# Patient Record
Sex: Male | Born: 1960 | Hispanic: Yes | Marital: Married | Smoking: Current every day smoker
Health system: Southern US, Community
[De-identification: ages and names within clinical notes are randomized; demographics above are authoritative.]

## PROBLEM LIST (undated history)

## (undated) DIAGNOSIS — B182 Chronic viral hepatitis C: Secondary | ICD-10-CM

## (undated) HISTORY — DX: Chronic viral hepatitis C: B18.2

## (undated) HISTORY — PX: CHOLECYSTECTOMY: SHX55

---

## 2015-12-11 ENCOUNTER — Ambulatory Visit: Payer: BLUE CROSS/BLUE SHIELD | Admitting: Family Medicine

## 2016-05-26 ENCOUNTER — Encounter: Payer: Self-pay | Admitting: Family Medicine

## 2016-05-26 ENCOUNTER — Ambulatory Visit (INDEPENDENT_AMBULATORY_CARE_PROVIDER_SITE_OTHER): Payer: BLUE CROSS/BLUE SHIELD | Admitting: Family Medicine

## 2016-05-26 VITALS — BP 106/72 | HR 78 | Temp 97.8°F | Resp 16 | Ht 69.0 in | Wt 203.0 lb

## 2016-05-26 DIAGNOSIS — Z23 Encounter for immunization: Secondary | ICD-10-CM | POA: Diagnosis not present

## 2016-05-26 DIAGNOSIS — B182 Chronic viral hepatitis C: Secondary | ICD-10-CM | POA: Diagnosis not present

## 2016-05-26 DIAGNOSIS — Z0000 Encounter for general adult medical examination without abnormal findings: Secondary | ICD-10-CM | POA: Diagnosis not present

## 2016-05-26 LAB — CBC WITH DIFFERENTIAL/PLATELET
BASOS PCT: 1 %
Basophils Absolute: 75 cells/uL (ref 0–200)
Eosinophils Absolute: 300 cells/uL (ref 15–500)
Eosinophils Relative: 4 %
HEMATOCRIT: 44.9 % (ref 38.5–50.0)
Hemoglobin: 15.5 g/dL (ref 13.0–17.0)
LYMPHS ABS: 1500 {cells}/uL (ref 850–3900)
LYMPHS PCT: 20 %
MCH: 30.2 pg (ref 27.0–33.0)
MCHC: 34.5 g/dL (ref 32.0–36.0)
MCV: 87.4 fL (ref 80.0–100.0)
MONO ABS: 525 {cells}/uL (ref 200–950)
MPV: 10.4 fL (ref 7.5–12.5)
Monocytes Relative: 7 %
NEUTROS ABS: 5100 {cells}/uL (ref 1500–7800)
Neutrophils Relative %: 68 %
Platelets: 255 10*3/uL (ref 140–400)
RBC: 5.14 MIL/uL (ref 4.20–5.80)
RDW: 13 % (ref 11.0–15.0)
WBC: 7.5 10*3/uL (ref 3.8–10.8)

## 2016-05-26 LAB — PSA: PSA: 0.9 ng/mL (ref <=4.0)

## 2016-05-26 NOTE — Addendum Note (Signed)
Addended by: Shary Decamp B on: 05/26/2016 10:51 AM   Modules accepted: Orders

## 2016-05-26 NOTE — Progress Notes (Signed)
Subjective:    Patient ID: Caleb Hunt, male    DOB: Jan 30, 1961, 55 y.o.   MRN: [id number]  HPI Patient has a history of hepatitis C. He was diagnosed with hepatitis C in 1994. He has not been treated for hepatitis C. Otherwise his past medical history is noncontributory. He is due for a prostate exam, PSA, and a screening colonoscopy. He is also due for a flu shot. Family history is significant for type 2 diabetes mellitus. Otherwise his review of systems is negative Past Medical History:  Diagnosis Date  . Hep C w/o coma, chronic (HCC)    Past Surgical History:  Procedure Laterality Date  . CHOLECYSTECTOMY     No current outpatient prescriptions on file prior to visit.   No current facility-administered medications on file prior to visit.    No Known Allergies Social History   Social History  . Marital status: Married    Spouse name: N/A  . Number of children: N/A  . Years of education: N/A   Occupational History  . Not on file.   Social History Main Topics  . Smoking status: Current Every Day Smoker  . Smokeless tobacco: Never Used  . Alcohol use Yes     Comment: every other week  . Drug use: No  . Sexual activity: Not on file   Other Topics Concern  . Not on file   Social History Narrative  . No narrative on file   Family History  Problem Relation Age of Onset  . Diabetes Mother   . Diabetes Father   . Heart disease Father   . Hepatitis C Father   . Diabetes Sister   . Psoriasis Sister       Review of Systems  All other systems reviewed and are negative.      Objective:   Physical Exam  Constitutional: He is oriented to person, place, and time. He appears well-developed and well-nourished. No distress.  HENT:  Head: Normocephalic and atraumatic.  Right Ear: External ear normal.  Left Ear: External ear normal.  Nose: Nose normal.  Mouth/Throat: Oropharynx is clear and moist. No oropharyngeal exudate.  Eyes: Conjunctivae and EOM are normal.  Pupils are equal, round, and reactive to light. Right eye exhibits no discharge. Left eye exhibits no discharge. No scleral icterus.  Neck: Normal range of motion. Neck supple. No JVD present. No tracheal deviation present. No thyromegaly present.  Cardiovascular: Normal rate, regular rhythm, normal heart sounds and intact distal pulses.  Exam reveals no gallop and no friction rub.   No murmur heard. Pulmonary/Chest: Effort normal and breath sounds normal. No stridor. No respiratory distress. He has no wheezes. He has no rales. He exhibits no tenderness.  Abdominal: Soft. Bowel sounds are normal. He exhibits no distension and no mass. There is no tenderness. There is no rebound and no guarding.  Genitourinary: Prostate normal. Rectal exam shows external hemorrhoid. Rectal exam shows no internal hemorrhoid, no fissure, no mass, no tenderness and anal tone normal.  Musculoskeletal: Normal range of motion. He exhibits no edema, tenderness or deformity.  Lymphadenopathy:    He has no cervical adenopathy.  Neurological: He is alert and oriented to person, place, and time. He has normal reflexes. He displays normal reflexes. No cranial nerve deficit. He exhibits normal muscle tone. Coordination normal.  Skin: Skin is warm. No rash noted. He is not diaphoretic. No erythema. No pallor.  Psychiatric: He has a normal mood and affect. His behavior is normal. Judgment  and thought content normal.  Vitals reviewed.         Assessment & Plan:  Routine general medical examination at a health care facility - Plan: CBC with Differential/Platelet, COMPLETE METABOLIC PANEL WITH GFR, Lipid panel, PSA, Ambulatory referral to Gastroenterology  Hep C w/o coma, chronic (HCC) - Plan: Hepatitis C Ab Reflex HCV RNA, QUANT, Hepatitis C Genotype  His blood pressure today is normal. I will check a CBC, CMP, and a fasting lipid panel. His tetanus shot is up-to-date. I did recommend an annual flu shot.. I will schedule the  patient to see a gastroenterologist for screening colonoscopy. I'll check a hepatitis C genotype as well as a hepatitis C viral load. If the patient has active hepatitis C, I will consult the hep C clinic to arrange for treatment.

## 2016-05-27 LAB — COMPLETE METABOLIC PANEL WITH GFR
ALT: 12 U/L (ref 9–46)
AST: 19 U/L (ref 10–35)
Albumin: 4.8 g/dL (ref 3.6–5.1)
Alkaline Phosphatase: 104 U/L (ref 40–115)
BUN: 19 mg/dL (ref 7–25)
CALCIUM: 9.9 mg/dL (ref 8.6–10.3)
CHLORIDE: 101 mmol/L (ref 98–110)
CO2: 27 mmol/L (ref 20–31)
CREATININE: 1.19 mg/dL (ref 0.70–1.33)
GFR, Est African American: 79 mL/min (ref >=60)
GFR, Est Non African American: 68 mL/min (ref >=60)
Glucose, Bld: 91 mg/dL (ref 70–99)
Potassium: 4.3 mmol/L (ref 3.5–5.3)
Sodium: 139 mmol/L (ref 135–146)
Total Bilirubin: 0.7 mg/dL (ref 0.2–1.2)
Total Protein: 7.9 g/dL (ref 6.1–8.1)

## 2016-05-27 LAB — LIPID PANEL
CHOL/HDL RATIO: 3.4 ratio (ref <=5.0)
CHOLESTEROL: 163 mg/dL (ref 125–200)
HDL: 48 mg/dL (ref >=40)
LDL CALC: 91 mg/dL (ref <130)
TRIGLYCERIDES: 121 mg/dL (ref <150)
VLDL: 24 mg/dL (ref <30)

## 2016-05-27 LAB — HEPATITIS C ANTIBODY: HCV Ab: REACTIVE — AB

## 2016-05-28 ENCOUNTER — Other Ambulatory Visit: Payer: Self-pay | Admitting: Family Medicine

## 2016-05-28 DIAGNOSIS — B182 Chronic viral hepatitis C: Secondary | ICD-10-CM

## 2016-05-28 LAB — HEPATITIS C RNA QUANTITATIVE
HCV QUANT: 248 [IU]/mL — AB (ref <15)
HCV Quantitative Log: 2.39 {Log} — ABNORMAL HIGH (ref <1.18)

## 2016-05-29 ENCOUNTER — Encounter (INDEPENDENT_AMBULATORY_CARE_PROVIDER_SITE_OTHER): Payer: Self-pay | Admitting: *Deleted

## 2016-06-01 LAB — HEPATITIS C GENOTYPE

## 2016-06-10 ENCOUNTER — Telehealth: Payer: Self-pay | Admitting: *Deleted

## 2016-06-10 NOTE — Telephone Encounter (Signed)
Left message stating patient had been referred for treatment, asked him to call back for scheduling. Landis Gandy, RN

## 2016-06-17 ENCOUNTER — Encounter: Payer: Self-pay | Admitting: Family Medicine

## 2016-07-08 ENCOUNTER — Telehealth: Payer: Self-pay | Admitting: *Deleted

## 2016-07-08 NOTE — Telephone Encounter (Signed)
Called patient and left a voice mail stating he was referred to this clinic by his PCP and to call Dr. Henreitta Leber office to schedule an appointment. Myrtis Hopping

## 2016-08-21 ENCOUNTER — Telehealth: Payer: Self-pay | Admitting: Family Medicine

## 2016-08-21 NOTE — Telephone Encounter (Signed)
Margaret from infectious disease calling to let you know that he cannot be reached at the number provided for referral

## 2016-08-27 NOTE — Telephone Encounter (Signed)
[facility] have not been able to reach this patient by mail or phone!!!  I am going to try to send letter.  Phone number says does not live here anymore???

## 2019-12-15 ENCOUNTER — Ambulatory Visit: Payer: Self-pay | Attending: Internal Medicine

## 2019-12-15 DIAGNOSIS — Z23 Encounter for immunization: Secondary | ICD-10-CM

## 2019-12-15 NOTE — Progress Notes (Signed)
   Covid-19 Vaccination Clinic  Name:  Osaze Kleinfeldt    MRN: [id number] DOB: [birth year]  12/15/2019  Mr. Gossage was observed post Covid-19 immunization for 15 minutes without incident. He was provided with Vaccine Information Sheet and instruction to access the V-Safe system.   Mr. Gayhart was instructed to call 911 with any severe reactions post vaccine: Marland Kitchen Difficulty breathing  . Swelling of face and throat  . A fast heartbeat  . A bad rash all over body  . Dizziness and weakness   Immunizations Administered    Name Date Dose VIS Date Route   Pfizer COVID-19 Vaccine 12/15/2019 10:29 AM 0.3 mL 10/18/2018 Intramuscular   Manufacturer: [place]   Lot: [id number]   Protection: [id number]

## 2020-01-08 ENCOUNTER — Ambulatory Visit: Payer: Self-pay | Attending: Internal Medicine

## 2020-01-08 DIAGNOSIS — Z23 Encounter for immunization: Secondary | ICD-10-CM

## 2020-01-08 NOTE — Progress Notes (Signed)
   Covid-19 Vaccination Clinic  Name:  Caleb Hunt    MRN: [id number] DOB: 03-22-1961  01/08/2020  Caleb Hunt was observed post Covid-19 immunization for 15 minutes without incident. He was provided with Vaccine Information Sheet and instruction to access the V-Safe system.   Caleb Hunt was instructed to call 911 with any severe reactions post vaccine: Marland Kitchen Difficulty breathing  . Swelling of face and throat  . A fast heartbeat  . A bad rash all over body  . Dizziness and weakness   Immunizations Administered    Name Date Dose VIS Date Route   Pfizer COVID-19 Vaccine 01/08/2020 10:40 AM 0.3 mL 10/18/2018 Intramuscular   Manufacturer: [place]   Lot: [id number]   [place]: [id number]

## 2020-05-17 ENCOUNTER — Encounter: Payer: Self-pay | Admitting: Internal Medicine

## 2020-05-17 ENCOUNTER — Other Ambulatory Visit: Payer: Self-pay

## 2020-05-17 ENCOUNTER — Ambulatory Visit (INDEPENDENT_AMBULATORY_CARE_PROVIDER_SITE_OTHER): Payer: No Typology Code available for payment source | Admitting: Internal Medicine

## 2020-05-17 VITALS — BP 117/77 | HR 68 | Resp 16 | Ht 69.0 in | Wt 223.1 lb

## 2020-05-17 DIAGNOSIS — N529 Male erectile dysfunction, unspecified: Secondary | ICD-10-CM | POA: Diagnosis not present

## 2020-05-17 DIAGNOSIS — K739 Chronic hepatitis, unspecified: Secondary | ICD-10-CM | POA: Diagnosis not present

## 2020-05-17 DIAGNOSIS — B1920 Unspecified viral hepatitis C without hepatic coma: Secondary | ICD-10-CM

## 2020-05-17 DIAGNOSIS — B351 Tinea unguium: Secondary | ICD-10-CM | POA: Diagnosis not present

## 2020-05-17 DIAGNOSIS — Z7689 Persons encountering health services in other specified circumstances: Secondary | ICD-10-CM | POA: Diagnosis not present

## 2020-05-17 DIAGNOSIS — Z23 Encounter for immunization: Secondary | ICD-10-CM

## 2020-05-17 DIAGNOSIS — Z720 Tobacco use: Secondary | ICD-10-CM

## 2020-05-17 DIAGNOSIS — Z1211 Encounter for screening for malignant neoplasm of colon: Secondary | ICD-10-CM | POA: Insufficient documentation

## 2020-05-17 DIAGNOSIS — E669 Obesity, unspecified: Secondary | ICD-10-CM

## 2020-05-17 DIAGNOSIS — B182 Chronic viral hepatitis C: Secondary | ICD-10-CM | POA: Insufficient documentation

## 2020-05-17 MED ORDER — SILDENAFIL CITRATE 50 MG PO TABS: 50.0000 mg | ORAL_TABLET | Freq: Every day | ORAL | 0 refills | Status: DC | PRN

## 2020-05-17 MED ORDER — EFINACONAZOLE 10 % EX SOLN: 1.0000 "application " | Freq: Every day | CUTANEOUS | 1 refills | Status: DC

## 2020-05-17 NOTE — Assessment & Plan Note (Signed)
Previously diagnosed in 1999, was confirmed to have positive RNA in 2017 F/u Hep C antibody and RNA testing Recommend to follow up with GI Will check CMP for now, plan to obtain Abdominal US later

## 2020-05-17 NOTE — Patient Instructions (Signed)
Please get blood tests done before next visit.  You are given referral to GI clinic for colonoscopy.  You advised to quit smoking. Please follow low-salt and low-carbohydrate diet. Perform moderate exercise at least 30 mins/day for 5 days in a week.  Please take medications as prescribed and follow up after 3 weeks.

## 2020-05-17 NOTE — Assessment & Plan Note (Signed)
Efinaconazole cream prescribed Will switch to oral terbinafine after checking CMP

## 2020-05-17 NOTE — Assessment & Plan Note (Signed)
Care established Previous chart reviewed History reviewed with the patient

## 2020-05-17 NOTE — Progress Notes (Signed)
New Patient Office Visit  Subjective:  Patient ID: Caleb Hunt, male    DOB: 09/17/1960  Age: 59 y.o. MRN: [id number]  CC:  Chief Complaint  Patient presents with  . New Patient (Initial Visit)    establish care    HPI Caleb Hunt is a 59 year old male with past medical history of chronic hep C and tobacco use presents for establishing care.  Patient states that he was last evaluated in 2017 by Dr. Dennard Schaumann and was diagnosed with chronic hep C at that time (initial hep C diagnosis was in 1999).  Patient did not follow-up with PCP since then.  He mentions that he has dull RUQ pain occasionally, but denies nausea, vomiting, abdominal pain, constipation or diarrhea.  Patient denies any recent weight or appetite changes.  Patient mentions that he has been having problems with erection for last 2 years, has morning erections sometimes, denies any contributing stressors.  Patient has not had any colonoscopy yet, GI referral provided for colonoscopy.  He states that he smoked about 2 packs/day for 40 years, quit for 2 years and then started smoking 1 cigarette/day since last year.  Upon counseling, patient is willing to quit smoking.  Patient denies taking any medications currently.  Had Covid vaccine in 11/2019 and 12/2019.  Requests flu vaccine today.  Past Medical History:  Diagnosis Date  . Hep C w/o coma, chronic (HCC)     Past Surgical History:  Procedure Laterality Date  . CHOLECYSTECTOMY      Family History  Problem Relation Age of Onset  . Diabetes Mother   . Diabetes Father   . Heart disease Father   . Hepatitis C Father   . Diabetes Sister   . Psoriasis Sister     Social History   Socioeconomic History  . Marital status: Married    Spouse name: Not on file  . Number of children: Not on file  . Years of education: Not on file  . Highest education level: Not on file  Occupational History  . Not on file  Tobacco Use  . Smoking status: Current Every Day  Smoker    Types: Cigarettes  . Smokeless tobacco: Never Used  . Tobacco comment: Smoked 2 packs/day for 40 years, quit for 2 years and started smoking 1 cigarette a day for last 1 year.  Substance and Sexual Activity  . Alcohol use: Yes    Comment: every other week  . Drug use: No  . Sexual activity: Not on file  Other Topics Concern  . Not on file  Social History Narrative  . Not on file   Social Determinants of Health   Financial Resource Strain:   . Difficulty of Paying Living Expenses: Not on file  Food Insecurity:   . Worried About Charity fundraiser in the Last Year: Not on file  . Ran Out of Food in the Last Year: Not on file  Transportation Needs:   . Lack of Transportation (Medical): Not on file  . Lack of Transportation (Non-Medical): Not on file  Physical Activity:   . Days of Exercise per Week: Not on file  . Minutes of Exercise per Session: Not on file  Stress:   . Feeling of Stress : Not on file  Social Connections:   . Frequency of Communication with Friends and Family: Not on file  . Frequency of Social Gatherings with Friends and Family: Not on file  . Attends Religious Services: Not on file  .  Active Member of Clubs or Organizations: Not on file  . Attends Archivist Meetings: Not on file  . Marital Status: Not on file  Intimate Partner Violence:   . Fear of Current or Ex-Partner: Not on file  . Emotionally Abused: Not on file  . Physically Abused: Not on file  . Sexually Abused: Not on file    ROS Review of Systems  Constitutional: Negative for chills and fever.  HENT: Negative for congestion and sore throat.   Eyes: Negative for pain and discharge.  Respiratory: Negative for cough and shortness of breath.   Cardiovascular: Negative for chest pain and palpitations.  Gastrointestinal: Negative for constipation, diarrhea, nausea and vomiting.  Endocrine: Negative for polydipsia and polyuria.  Genitourinary: Negative for dysuria and  hematuria.  Musculoskeletal: Negative for neck pain and neck stiffness.  Skin: Negative for rash.  Neurological: Negative for dizziness, weakness, numbness and headaches.  Psychiatric/Behavioral: Negative for agitation and behavioral problems.    Objective:   Today's Vitals: BP 117/77   Pulse 68   Resp 16   Ht 5\' 9"  (1.753 m)   Wt 223 lb 1.9 oz (101.2 kg)   SpO2 98%   BMI 32.95 kg/m   Physical Exam Vitals reviewed.  Constitutional:      General: He is not in acute distress.    Appearance: He is not diaphoretic.  HENT:     Head: Normocephalic and atraumatic.     Nose: Nose normal.     Mouth/Throat:     Mouth: Mucous membranes are moist.  Eyes:     General: No scleral icterus.    Extraocular Movements: Extraocular movements intact.     Pupils: Pupils are equal, round, and reactive to light.  Cardiovascular:     Rate and Rhythm: Normal rate and regular rhythm.     Pulses: Normal pulses.     Heart sounds: No murmur heard.   Pulmonary:     Breath sounds: Normal breath sounds. No wheezing or rales.  Abdominal:     Palpations: Abdomen is soft.     Tenderness: There is no abdominal tenderness.  Musculoskeletal:     Cervical back: Neck supple. No tenderness.     Right lower leg: No edema.     Left lower leg: No edema.  Skin:    General: Skin is warm.     Comments: Onychomycosis of fingernails (second and third finger of left hand)  Neurological:     General: No focal deficit present.     Mental Status: He is alert and oriented to person, place, and time.  Psychiatric:        Mood and Affect: Mood normal.        Behavior: Behavior normal.     Assessment & Plan:   Encounter to establish care Care established Previous chart reviewed History reviewed with the patient   Hepatitis C virus infection, unspecified chronicity Previously diagnosed in 1999, was confirmed to have positive RNA in 2017 F/u Hep C antibody and RNA testing Recommend to follow up with  GI Will check CMP for now, plan to obtain Abdominal US later  Obesity (BMI 30-39.9) Diet modification and moderate exercise discussed Patient expressed understanding.  Tobacco abuse Smoked 2 packs/day for about 40 years, quit for 2 years and now started smoking 1 cigarette/day for last 1 year  Asked about quitting: confirms they are currently smokeing cigarettes Advise to quit smoking: Educated about QUITTING to reduce the risk of cancer, cardio and cerebrovascular  disease. Assess willingness: Willing to quit Assist with counseling and pharmacotherapy: Counseled for 5 minutes and literature provided. Arrange for follow up: Will follow up and continue to offer help.   Erectile dysfunction Denies any acute stressors Has morning erections sometimes Sildenafil 50 mg as needed  Onychomycosis Efinaconazole cream prescribed Will switch to oral terbinafine after checking CMP  Flu vaccine administered in clinic.  Referral for colonoscopy provided.  Outpatient Encounter Medications as of 05/17/2020  Medication Sig  . Efinaconazole 10 % SOLN Apply 1 application topically daily.  . sildenafil (VIAGRA) 50 MG tablet Take 1 tablet (50 mg total) by mouth daily as needed for erectile dysfunction.   No facility-administered encounter medications on file as of 05/17/2020.    Follow-up: Return in about 3 weeks (around 06/07/2020).   Lindell Spar, MD

## 2020-05-17 NOTE — Assessment & Plan Note (Signed)
Diet modification and moderate exercise discussed Patient expressed understanding. 

## 2020-05-17 NOTE — Assessment & Plan Note (Signed)
Denies any acute stressors Has morning erections sometimes Sildenafil 50 mg as needed

## 2020-05-17 NOTE — Assessment & Plan Note (Signed)
Smoked 2 packs/day for about 40 years, quit for 2 years and now started smoking 1 cigarette/day for last 1 year  Asked about quitting: confirms they are currently smokeing cigarettes Advise to quit smoking: Educated about QUITTING to reduce the risk of cancer, cardio and cerebrovascular disease. Assess willingness: Willing to quit Assist with counseling and pharmacotherapy: Counseled for 5 minutes and literature provided. Arrange for follow up: Will follow up and continue to offer help.

## 2020-05-21 ENCOUNTER — Telehealth: Payer: Self-pay

## 2020-05-21 NOTE — Telephone Encounter (Signed)
Patient lvm stating the two medications prescribed by the provider was not covered by insurance and needed a prior auth. Explained to patient that most insurances will not cover the erectile dysfunction medication. The fungal medication will be changed to an oral medication once his labs can be reviewed. Patient is aware.

## 2020-05-22 ENCOUNTER — Encounter (INDEPENDENT_AMBULATORY_CARE_PROVIDER_SITE_OTHER): Payer: Self-pay | Admitting: Gastroenterology

## 2020-05-22 ENCOUNTER — Telehealth (INDEPENDENT_AMBULATORY_CARE_PROVIDER_SITE_OTHER): Payer: Self-pay | Admitting: *Deleted

## 2020-05-22 ENCOUNTER — Encounter (INDEPENDENT_AMBULATORY_CARE_PROVIDER_SITE_OTHER): Payer: Self-pay | Admitting: *Deleted

## 2020-05-22 ENCOUNTER — Telehealth: Payer: Self-pay

## 2020-05-22 ENCOUNTER — Other Ambulatory Visit: Payer: Self-pay

## 2020-05-22 ENCOUNTER — Ambulatory Visit (INDEPENDENT_AMBULATORY_CARE_PROVIDER_SITE_OTHER): Payer: No Typology Code available for payment source | Admitting: Gastroenterology

## 2020-05-22 VITALS — BP 112/73 | HR 71 | Temp 98.1°F | Ht 69.0 in | Wt 222.5 lb

## 2020-05-22 DIAGNOSIS — B182 Chronic viral hepatitis C: Secondary | ICD-10-CM | POA: Diagnosis not present

## 2020-05-22 DIAGNOSIS — Z1211 Encounter for screening for malignant neoplasm of colon: Secondary | ICD-10-CM

## 2020-05-22 DIAGNOSIS — Z7189 Other specified counseling: Secondary | ICD-10-CM

## 2020-05-22 MED ORDER — PEG 3350-KCL-NA BICARB-NACL 420 G PO SOLR: 4000.0000 mL | Freq: Once | ORAL | 0 refills | Status: AC
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
## 2020-05-22 NOTE — Telephone Encounter (Signed)
Prior auth sent to [employer]. Waiting on response.

## 2020-05-22 NOTE — Telephone Encounter (Signed)
Patient needs trilyte 

## 2020-05-22 NOTE — Patient Instructions (Signed)
Schedule colonoscopy Perform blood workup   
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
## 2020-05-22 NOTE — Telephone Encounter (Signed)
Please start a Pre Auth on Medication Sildenafil

## 2020-05-25 LAB — HEPATITIS C RNA QUANTITATIVE
HCV RNA, PCR, QN (Log): 4.95 log IU/mL — ABNORMAL HIGH
HCV RNA, PCR, QN: 90100 IU/mL — ABNORMAL HIGH

## 2020-05-26 LAB — HEPATITIS C GENOTYPE

## 2020-05-26 LAB — HEPATITIS B CORE ANTIBODY, TOTAL: Hep B Core Total Ab: NONREACTIVE

## 2020-05-26 LAB — HIV ANTIBODY (ROUTINE TESTING W REFLEX): HIV 1&2 Ab, 4th Generation: NONREACTIVE

## 2020-05-26 LAB — HEPATITIS A ANTIBODY, TOTAL: Hepatitis A AB,Total: REACTIVE — AB

## 2020-05-26 LAB — HEPATITIS B SURFACE ANTIGEN: Hepatitis B Surface Ag: NONREACTIVE

## 2020-05-30 ENCOUNTER — Other Ambulatory Visit (INDEPENDENT_AMBULATORY_CARE_PROVIDER_SITE_OTHER): Payer: Self-pay | Admitting: Gastroenterology

## 2020-05-30 DIAGNOSIS — B182 Chronic viral hepatitis C: Secondary | ICD-10-CM

## 2020-06-05 ENCOUNTER — Other Ambulatory Visit (INDEPENDENT_AMBULATORY_CARE_PROVIDER_SITE_OTHER): Payer: Self-pay | Admitting: *Deleted

## 2020-06-07 ENCOUNTER — Ambulatory Visit (INDEPENDENT_AMBULATORY_CARE_PROVIDER_SITE_OTHER): Payer: No Typology Code available for payment source | Admitting: Internal Medicine

## 2020-06-07 ENCOUNTER — Other Ambulatory Visit: Payer: Self-pay

## 2020-06-07 ENCOUNTER — Encounter: Payer: Self-pay | Admitting: Internal Medicine

## 2020-06-07 VITALS — BP 116/75 | HR 65 | Temp 97.4°F | Resp 18 | Ht 69.0 in | Wt 226.8 lb

## 2020-06-07 DIAGNOSIS — B351 Tinea unguium: Secondary | ICD-10-CM | POA: Diagnosis not present

## 2020-06-07 DIAGNOSIS — N529 Male erectile dysfunction, unspecified: Secondary | ICD-10-CM | POA: Diagnosis not present

## 2020-06-07 DIAGNOSIS — B182 Chronic viral hepatitis C: Secondary | ICD-10-CM | POA: Diagnosis not present

## 2020-06-07 MED ORDER — SILDENAFIL CITRATE 50 MG PO TABS: 100.0000 mg | ORAL_TABLET | Freq: Every day | ORAL | 5 refills | Status: DC | PRN

## 2020-06-07 MED ORDER — SILDENAFIL CITRATE 100 MG PO TABS: 100.0000 mg | ORAL_TABLET | Freq: Every day | ORAL | 5 refills | Status: DC | PRN

## 2020-06-07 NOTE — Patient Instructions (Addendum)
Please use barrier contraceptives (I.e., condom) while having sexual intercourse as you have history of chronic hepatitis C. Please follow up with GI doctor for management of hepatitis C and colonoscopy.  Please use Lamisil for nail infection.  Please get the blood tests done as advised.

## 2020-06-08 NOTE — Progress Notes (Signed)
Established Patient Office Visit  Subjective:  Patient ID: Caleb Hunt, male    DOB: 1961-05-30  Age: 59 y.o. MRN: [id number]  CC:  Chief Complaint  Patient presents with  . Follow-up    3 week follow up everything is going ok today     HPI Veryl Hunt is a 59 year old male with PMH of chronic hepatitis C presents for follow up of blood test results. Patient states that he had erectile dysfunction even with Sildenafil 1 tablet, but he has been having better outcome with 2 tablets. He requests a prescription for 100 mg. Patient was counseled for safe sex practices as he and his wife both have chronic hepatitis C.  Patient did not get routine blood tests done. He has done only Hepatitis workup ordered by GI. Patient is scheduled for colonoscopy in the next week.  Past Medical History:  Diagnosis Date  . Hep C w/o coma, chronic (HCC)     Past Surgical History:  Procedure Laterality Date  . CHOLECYSTECTOMY      Family History  Problem Relation Age of Onset  . Diabetes Mother   . Diabetes Father   . Heart disease Father   . Hepatitis C Father   . Diabetes Sister   . Psoriasis Sister     Social History   Socioeconomic History  . Marital status: Married    Spouse name: Not on file  . Number of children: Not on file  . Years of education: Not on file  . Highest education level: Not on file  Occupational History  . Not on file  Tobacco Use  . Smoking status: Current Every Day Smoker    Packs/day: 0.25    Types: Cigarettes  . Smokeless tobacco: Never Used  . Tobacco comment: Patient states he smokes one cigarette daily  Vaping Use  . Vaping Use: Never used  Substance and Sexual Activity  . Alcohol use: Not Currently  . Drug use: No  . Sexual activity: Not on file  Other Topics Concern  . Not on file  Social History Narrative  . Not on file   Social Determinants of Health   Financial Resource Strain:   . Difficulty of Paying Living Expenses: Not on file    Food Insecurity:   . Worried About Charity fundraiser in the Last Year: Not on file  . Ran Out of Food in the Last Year: Not on file  Transportation Needs:   . Lack of Transportation (Medical): Not on file  . Lack of Transportation (Non-Medical): Not on file  Physical Activity:   . Days of Exercise per Week: Not on file  . Minutes of Exercise per Session: Not on file  Stress:   . Feeling of Stress : Not on file  Social Connections:   . Frequency of Communication with Friends and Family: Not on file  . Frequency of Social Gatherings with Friends and Family: Not on file  . Attends Religious Services: Not on file  . Active Member of Clubs or Organizations: Not on file  . Attends Archivist Meetings: Not on file  . Marital Status: Not on file  Intimate Partner Violence:   . Fear of Current or Ex-Partner: Not on file  . Emotionally Abused: Not on file  . Physically Abused: Not on file  . Sexually Abused: Not on file    Outpatient Medications Prior to Visit  Medication Sig Dispense Refill  . MILK THISTLE PO Take 1 capsule by mouth  daily.    . Multiple Vitamins-Minerals (MULTIVITAMIN WITH MINERALS) tablet Take 1 tablet by mouth daily.    . sildenafil (VIAGRA) 50 MG tablet Take 1 tablet (50 mg total) by mouth daily as needed for erectile dysfunction. 10 tablet 0  . Efinaconazole 10 % SOLN Apply 1 application topically daily. (Patient not taking: Reported on 05/22/2020) 8 mL 1   No facility-administered medications prior to visit.    No Known Allergies  ROS Review of Systems  Constitutional: Negative for chills and fever.  HENT: Negative for congestion and sore throat.   Eyes: Negative for pain and discharge.  Respiratory: Negative for cough and shortness of breath.   Cardiovascular: Negative for chest pain and palpitations.  Gastrointestinal: Negative for constipation, diarrhea, nausea and vomiting.  Endocrine: Negative for polydipsia and polyuria.  Genitourinary:  Negative for dysuria and hematuria.  Musculoskeletal: Negative for neck pain and neck stiffness.  Skin: Negative for rash.  Neurological: Negative for dizziness, weakness, numbness and headaches.  Psychiatric/Behavioral: Negative for agitation and behavioral problems.      Objective:    Physical Exam Vitals reviewed.  Constitutional:      General: He is not in acute distress.    Appearance: He is not diaphoretic.  HENT:     Head: Normocephalic and atraumatic.     Nose: Nose normal.     Mouth/Throat:     Mouth: Mucous membranes are moist.  Eyes:     General: No scleral icterus.    Extraocular Movements: Extraocular movements intact.     Pupils: Pupils are equal, round, and reactive to light.  Cardiovascular:     Rate and Rhythm: Normal rate and regular rhythm.     Pulses: Normal pulses.     Heart sounds: No murmur heard.   Pulmonary:     Breath sounds: Normal breath sounds. No wheezing or rales.  Abdominal:     Palpations: Abdomen is soft.     Tenderness: There is no abdominal tenderness.  Musculoskeletal:     Cervical back: Neck supple. No tenderness.     Right lower leg: No edema.     Left lower leg: No edema.  Skin:    General: Skin is warm.     Comments: Onychomycosis of fingernails (second and third finger of left hand)  Neurological:     General: No focal deficit present.     Mental Status: He is alert and oriented to person, place, and time.  Psychiatric:        Mood and Affect: Mood normal.        Behavior: Behavior normal.     BP 116/75 (BP Location: Left Arm, Patient Position: Sitting, Cuff Size: Normal)   Pulse 65   Temp (!) 97.4 F (36.3 C) (Temporal)   Resp 18   Ht 5\' 9"  (1.753 m)   Wt 226 lb 12.8 oz (102.9 kg)   SpO2 97%   BMI 33.49 kg/m  Wt Readings from Last 3 Encounters:  06/07/20 226 lb 12.8 oz (102.9 kg)  05/22/20 222 lb 8 oz (100.9 kg)  05/17/20 223 lb 1.9 oz (101.2 kg)     Health Maintenance Due  Topic Date Due  . TETANUS/TDAP   Never done  . COLONOSCOPY  Never done    There are no preventive care reminders to display for this patient.  No results found for: TSH Lab Results  Component Value Date   WBC 7.5 05/26/2016   HGB 15.5 05/26/2016   HCT 44.9 05/26/2016  MCV 87.4 05/26/2016   PLT 255 05/26/2016   Lab Results  Component Value Date   NA 139 05/26/2016   K 4.3 05/26/2016   CO2 27 05/26/2016   GLUCOSE 91 05/26/2016   BUN 19 05/26/2016   CREATININE 1.19 05/26/2016   BILITOT 0.7 05/26/2016   ALKPHOS 104 05/26/2016   AST 19 05/26/2016   ALT 12 05/26/2016   PROT 7.9 05/26/2016   ALBUMIN 4.8 05/26/2016   CALCIUM 9.9 05/26/2016   Lab Results  Component Value Date   CHOL 163 05/26/2016   Lab Results  Component Value Date   HDL 48 05/26/2016   Lab Results  Component Value Date   LDLCALC 91 05/26/2016   Lab Results  Component Value Date   TRIG 121 05/26/2016   Lab Results  Component Value Date   CHOLHDL 3.4 05/26/2016   No results found for: HGBA1C    Assessment & Plan:   Erectile dysfunction Prescribed Sildenafil 100 mg PRN Advised for safe sex practices, including condom use  Chronic Hep C Follows up with GI Hep C genotype and titer reviewed  Onychomycosis Patient did not start medication as advised. Advised to use Lamisil for now   Meds ordered this encounter  Medications  . DISCONTD: sildenafil (VIAGRA) 50 MG tablet    Sig: Take 2 tablets (100 mg total) by mouth daily as needed for erectile dysfunction.    Dispense:  10 tablet    Refill:  5  . sildenafil (VIAGRA) 100 MG tablet    Sig: Take 1 tablet (100 mg total) by mouth daily as needed for erectile dysfunction.    Dispense:  10 tablet    Refill:  5    Follow-up: Return in about 6 months (around 12/06/2020).    Lindell Spar, MD

## 2020-06-10 ENCOUNTER — Other Ambulatory Visit: Payer: Self-pay

## 2020-06-10 ENCOUNTER — Other Ambulatory Visit (HOSPITAL_COMMUNITY)
Admission: RE | Admit: 2020-06-10 | Discharge: 2020-06-10 | Disposition: A | Discharge status: Home / Self Care | Payer: No Typology Code available for payment source | Source: Ambulatory Visit | Attending: Gastroenterology | Admitting: Gastroenterology

## 2020-06-10 DIAGNOSIS — Z1211 Encounter for screening for malignant neoplasm of colon: Secondary | ICD-10-CM | POA: Diagnosis not present

## 2020-06-10 DIAGNOSIS — D122 Benign neoplasm of ascending colon: Secondary | ICD-10-CM | POA: Diagnosis not present

## 2020-06-10 DIAGNOSIS — D124 Benign neoplasm of descending colon: Secondary | ICD-10-CM | POA: Diagnosis not present

## 2020-06-10 DIAGNOSIS — Z01812 Encounter for preprocedural laboratory examination: Secondary | ICD-10-CM | POA: Insufficient documentation

## 2020-06-10 DIAGNOSIS — K621 Rectal polyp: Secondary | ICD-10-CM

## 2020-06-10 DIAGNOSIS — D123 Benign neoplasm of transverse colon: Secondary | ICD-10-CM | POA: Diagnosis not present

## 2020-06-10 DIAGNOSIS — Z20822 Contact with and (suspected) exposure to covid-19: Secondary | ICD-10-CM | POA: Insufficient documentation

## 2020-06-10 LAB — SARS CORONAVIRUS 2 (TAT 6-24 HRS): SARS Coronavirus 2: NEGATIVE

## 2020-06-11 ENCOUNTER — Encounter (HOSPITAL_COMMUNITY): Payer: Self-pay | Admitting: Gastroenterology

## 2020-06-11 ENCOUNTER — Other Ambulatory Visit: Payer: Self-pay

## 2020-06-11 ENCOUNTER — Encounter (HOSPITAL_COMMUNITY)
Admission: RE | Disposition: A | Discharge status: Home / Self Care | Payer: Self-pay | Source: Ambulatory Visit | Attending: Gastroenterology

## 2020-06-11 ENCOUNTER — Ambulatory Visit (HOSPITAL_COMMUNITY): Payer: PRIVATE HEALTH INSURANCE | Admitting: Anesthesiology

## 2020-06-11 ENCOUNTER — Ambulatory Visit (HOSPITAL_COMMUNITY)
Admission: RE | Admit: 2020-06-11 | Discharge: 2020-06-11 | Disposition: A | Discharge status: Home / Self Care | Payer: PRIVATE HEALTH INSURANCE | Source: Ambulatory Visit | Attending: Gastroenterology | Admitting: Gastroenterology

## 2020-06-11 DIAGNOSIS — K635 Polyp of colon: Secondary | ICD-10-CM | POA: Insufficient documentation

## 2020-06-11 DIAGNOSIS — D124 Benign neoplasm of descending colon: Secondary | ICD-10-CM | POA: Diagnosis not present

## 2020-06-11 DIAGNOSIS — Z1211 Encounter for screening for malignant neoplasm of colon: Secondary | ICD-10-CM | POA: Diagnosis present

## 2020-06-11 DIAGNOSIS — D123 Benign neoplasm of transverse colon: Secondary | ICD-10-CM | POA: Insufficient documentation

## 2020-06-11 DIAGNOSIS — K621 Rectal polyp: Secondary | ICD-10-CM | POA: Insufficient documentation

## 2020-06-11 DIAGNOSIS — F17210 Nicotine dependence, cigarettes, uncomplicated: Secondary | ICD-10-CM | POA: Diagnosis not present

## 2020-06-11 DIAGNOSIS — B182 Chronic viral hepatitis C: Secondary | ICD-10-CM | POA: Diagnosis not present

## 2020-06-11 DIAGNOSIS — F1911 Other psychoactive substance abuse, in remission: Secondary | ICD-10-CM | POA: Diagnosis not present

## 2020-06-11 HISTORY — PX: POLYPECTOMY: SHX5525

## 2020-06-11 HISTORY — PX: COLONOSCOPY WITH PROPOFOL: SHX5780

## 2020-06-11 LAB — HM COLONOSCOPY

## 2020-06-11 LAB — PROTIME-INR
INR: 1 (ref 0.8–1.2)
Prothrombin Time: 13 seconds (ref 11.4–15.2)

## 2020-06-11 SURGERY — COLONOSCOPY WITH PROPOFOL
Anesthesia: General

## 2020-06-11 MED ORDER — LACTATED RINGERS IV SOLN: INTRAVENOUS | Status: DC

## 2020-06-11 MED ORDER — PROPOFOL 10 MG/ML IV BOLUS
INTRAVENOUS | Status: AC
  Filled 2020-06-11: qty 80

## 2020-06-11 MED ORDER — PROPOFOL 10 MG/ML IV BOLUS
INTRAVENOUS | Status: DC | PRN
  Administered 2020-06-11: 100 mg via INTRAVENOUS

## 2020-06-11 MED ORDER — LACTATED RINGERS IV SOLN: INTRAVENOUS | Status: DC | PRN

## 2020-06-11 MED ORDER — PROPOFOL 10 MG/ML IV BOLUS
INTRAVENOUS | Status: AC
  Filled 2020-06-11: qty 40

## 2020-06-11 MED ORDER — EPHEDRINE SULFATE 50 MG/ML IJ SOLN
INTRAMUSCULAR | Status: DC | PRN
  Administered 2020-06-11: 10 mg via INTRAVENOUS

## 2020-06-11 MED ORDER — PROPOFOL 500 MG/50ML IV EMUL
INTRAVENOUS | Status: DC | PRN
  Administered 2020-06-11: 150 ug/kg/min via INTRAVENOUS

## 2020-06-11 MED ORDER — PHENYLEPHRINE HCL (PRESSORS) 10 MG/ML IV SOLN
INTRAVENOUS | Status: DC | PRN
  Administered 2020-06-11: 80 ug via INTRAVENOUS

## 2020-06-11 NOTE — Discharge Instructions (Signed)
You are being discharged to home.  Resume your previous diet.  We are waiting for your pathology results.  Your physician has recommended a repeat colonoscopy for surveillance based on pathology results.    Colonoscopy, Adult, Care After This sheet gives you information about how to care for yourself after your procedure. Your doctor may also give you more specific instructions. If you have problems or questions, call your doctor. What can I expect after the procedure? After the procedure, it is common to have:  A small amount of blood in your poop (stool) for 24 hours.  Some gas.  Mild cramping or bloating in your belly (abdomen). Follow these instructions at home: Eating and drinking   Drink enough fluid to keep your pee (urine) pale yellow.  Follow instructions from your doctor about what you cannot eat or drink.  Return to your normal diet as told by your doctor. Avoid heavy or fried foods that are hard to digest. Activity  Rest as told by your doctor.  Do not sit for a long time without moving. Get up to take short walks every 1-2 hours. This is important. Ask for help if you feel weak or unsteady.  Return to your normal activities as told by your doctor. Ask your doctor what activities are safe for you. To help cramping and bloating:   Try walking around.  Put heat on your belly as told by your doctor. Use the heat source that your doctor recommends, such as a moist heat pack or a heating pad. ? Put a towel between your skin and the heat source. ? Leave the heat on for 20-30 minutes. ? Remove the heat if your skin turns bright red. This is very important if you are unable to feel pain, heat, or cold. You may have a greater risk of getting burned. General instructions  For the first 24 hours after the procedure: ? Do not drive or use machinery. ? Do not sign important documents. ? Do not drink alcohol. ? Do your daily activities more slowly than normal. ? Eat  foods that are soft and easy to digest.  Take over-the-counter or prescription medicines only as told by your doctor.  Keep all follow-up visits as told by your doctor. This is important. Contact a doctor if:  You have blood in your poop 2-3 days after the procedure. Get help right away if:  You have more than a small amount of blood in your poop.  You see large clumps of tissue (blood clots) in your poop.  Your belly is swollen.  You feel like you may vomit (nauseous).  You vomit.  You have a fever.  You have belly pain that gets worse, and medicine does not help your pain. Summary  After the procedure, it is common to have a small amount of blood in your poop. You may also have mild cramping and bloating in your belly.  For the first 24 hours after the procedure, do not drive or use machinery, do not sign important documents, and do not drink alcohol.  Get help right away if you have a lot of blood in your poop, feel like you may vomit, have a fever, or have more belly pain. This information is not intended to replace advice given to you by your health care provider. Make sure you discuss any questions you have with your health care provider. Document Revised: 03/06/2019 Document Reviewed: 03/06/2019 Elsevier Patient Education  [place].  Colon Polyps  Polyps are tissue growths inside the body. Polyps can grow in many places, including the large intestine (colon). A polyp may be a round bump or a mushroom-shaped growth. You could have one polyp or several. Most colon polyps are noncancerous (benign). However, some colon polyps can become cancerous over time. Finding and removing the polyps early can help prevent this. What are the causes? The exact cause of colon polyps is not known. What increases the risk? You are more likely to develop this condition if you:  Have a family history of colon cancer or colon polyps.  Are older than 58 or older than 45 if you  are African American.  Have inflammatory bowel disease, such as ulcerative colitis or Crohn's disease.  Have certain hereditary conditions, such as: ? Familial adenomatous polyposis. ? Lynch syndrome. ? Turcot syndrome. ? Peutz-Jeghers syndrome.  Are overweight.  Smoke cigarettes.  Do not get enough exercise.  Drink too much alcohol.  Eat a diet that is high in fat and red meat and low in fiber.  Had childhood cancer that was treated with abdominal radiation. What are the signs or symptoms? Most polyps do not cause symptoms. If you have symptoms, they may include:  Blood coming from your rectum when having a bowel movement.  Blood in your stool. The stool may look dark red or black.  Abdominal pain.  A change in bowel habits, such as constipation or diarrhea. How is this diagnosed? This condition is diagnosed with a colonoscopy. This is a procedure in which a lighted, flexible scope is inserted into the anus and then passed into the colon to examine the area. Polyps are sometimes found when a colonoscopy is done as part of routine cancer screening tests. How is this treated? Treatment for this condition involves removing any polyps that are found. Most polyps can be removed during a colonoscopy. Those polyps will then be tested for cancer. Additional treatment may be needed depending on the results of testing. Follow these instructions at home: Lifestyle  Maintain a healthy weight, or lose weight if recommended by your health care provider.  Exercise every day or as told by your health care provider.  Do not use any products that contain nicotine or tobacco, such as cigarettes and e-cigarettes. If you need help quitting, ask your health care provider.  If you drink alcohol, limit how much you have: ? 0-1 drink a day for women. ? 0-2 drinks a day for men.  Be aware of how much alcohol is in your drink. In the [place] one drink equals one 12 oz bottle of beer (355 mL),  one 5 oz glass of wine (148 mL), or one 1 oz shot of hard liquor (44 mL). Eating and drinking   Eat foods that are high in fiber, such as fruits, vegetables, and whole grains.  Eat foods that are high in calcium and vitamin D, such as milk, cheese, yogurt, eggs, liver, fish, and broccoli.  Limit foods that are high in fat, such as fried foods and desserts.  Limit the amount of red meat and processed meat you eat, such as hot dogs, sausage, bacon, and lunch meats. General instructions  Keep all follow-up visits as told by your health care provider. This is important. ? This includes having regularly scheduled colonoscopies. ? Talk to your health care provider about when you need a colonoscopy. Contact a health care provider if:  You have new or worsening bleeding during a bowel movement.  You have new  or increased blood in your stool.  You have a change in bowel habits.  You lose weight for no known reason. Summary  Polyps are tissue growths inside the body. Polyps can grow in many places, including the colon.  Most colon polyps are noncancerous (benign), but some can become cancerous over time.  This condition is diagnosed with a colonoscopy.  Treatment for this condition involves removing any polyps that are found. Most polyps can be removed during a colonoscopy. This information is not intended to replace advice given to you by your health care provider. Make sure you discuss any questions you have with your health care provider. Document Revised: 11/25/2017 Document Reviewed: 11/25/2017 Elsevier Patient Education  [place].

## 2020-06-11 NOTE — Interval H&P Note (Signed)
History and Physical Interval Note:  06/11/2020 8:24 AM 59 y.o. male from [place] with past medical history of drug abuse in remission and history of chronic hepatitis C, who comes to the hospital to undergo screening colonoscopy.  The patient has never had a colonoscopy in the past.  She denies having any complaints such as melena, hematochezia, abdominal pain or distention, change in her bowel movement consistency or frequency, no changes in her weight recently.  No family history of colorectal cancer.  BP 111/75   Pulse 65   Temp 97.8 F (36.6 C) (Oral)   Resp (!) 24   Ht 5\' 9"  (1.753 m)   Wt 102.9 kg   BMI 33.49 kg/m  GENERAL: The patient is AO x3, in no acute distress. HEENT: Head is normocephalic and atraumatic. EOMI are intact. Mouth is well hydrated and without lesions. NECK: Supple. No masses LUNGS: Clear to auscultation. No presence of rhonchi/wheezing/rales. Adequate chest expansion HEART: RRR, normal s1 and s2. ABDOMEN: Soft, nontender, no guarding, no peritoneal signs, and nondistended. BS +. No masses. EXTREMITIES: Without any cyanosis, clubbing, rash, lesions or edema. NEUROLOGIC: AOx3, no focal motor deficit. SKIN: no jaundice, no rashes   Caleb Hunt  has presented today for surgery, with the diagnosis of screening.  The various methods of treatment have been discussed with the patient and family. After consideration of risks, benefits and other options for treatment, the patient has consented to  Procedure(s) with comments: COLONOSCOPY WITH PROPOFOL (N/A) - 945 as a surgical intervention.  The patient's history has been reviewed, patient examined, no change in status, stable for surgery.  I have reviewed the patient's chart and labs.  Questions were answered to the patient's satisfaction.     Caleb Hunt

## 2020-06-11 NOTE — Op Note (Signed)
[facility] Patient Name: Caleb Hunt Procedure Date: 06/11/2020 8:53 AM MRN: [id number] Date of Birth: 1960-12-19 Attending MD: [name] ,  CSN: [id number] Age: 59 Admit Type: Outpatient Procedure:                Colonoscopy Indications:              Screening for colorectal malignant neoplasm Providers:                [name], [place] Sharon Seller, RN, Rosina Lowenstein, RN Referring MD:              Medicines:                Monitored Anesthesia Care Complications:            No immediate complications. Estimated Blood Loss:     Estimated blood loss: none. Procedure:                Pre-Anesthesia Assessment:                           - Prior to the procedure, a History and Physical                            was performed, and patient medications, allergies                            and sensitivities were reviewed. The patient's                            tolerance of previous anesthesia was reviewed.                           - The risks and benefits of the procedure and the                            sedation options and risks were discussed with the                            patient. All questions were answered and informed                            consent was obtained.                           - ASA Grade Assessment: II - A patient with mild                            systemic disease.                           After obtaining informed consent, the colonoscope                            was passed under direct vision. Throughout the  procedure, the patient's blood pressure, pulse, and                            oxygen saturations were monitored continuously. The                            [id number]-[id number] ([id number]) was introduced through the                            anus and advanced to the the cecum, identified by                            appendiceal orifice and ileocecal valve. The                             colonoscopy was performed without difficulty. The                            patient tolerated the procedure well. The quality                            of the bowel preparation was good. Scope withdrawal                            time was 15 minutes. Scope In: 9:00:52 AM Scope Out: 9:35:21 AM Scope Withdrawal Time: 0 hours 25 minutes 1 second  Total Procedure Duration: 0 hours 34 minutes 29 seconds  Findings:      The perianal and digital rectal examinations were normal.      Two sessile polyps were found in the transverse colon and ascending       colon. The polyps were 5 to 8 mm in size. These polyps were removed with       a cold snare. Resection and retrieval were complete.      A 3 mm polyp was found in the transverse colon. The polyp was sessile.       The polyp was removed with a cold biopsy forceps. Resection and       retrieval were complete.      Two sessile polyps were found in the rectum and descending colon. The       polyps were 3 to 8 mm in size. These polyps were removed with a cold       snare. Resection and retrieval were complete.      The retroflexed view of the distal rectum and anal verge was normal and       showed no anal or rectal abnormalities. Impression:               - Two 5 to 8 mm polyps in the transverse colon and                            in the ascending colon, removed with a cold snare.                            Resected and retrieved.                           -  One 3 mm polyp in the transverse colon, removed                            with a cold biopsy forceps. Resected and retrieved.                           - Two 3 to 8 mm polyps in the rectum and in the                            descending colon, removed with a cold snare.                            Resected and retrieved.                           - The distal rectum and anal verge are normal on                            retroflexion view. Moderate Sedation:      Per Anesthesia  Care Recommendation:           - Discharge patient to home (ambulatory).                           - Resume previous diet.                           - Await pathology results.                           - Repeat colonoscopy for surveillance based on                            pathology results. Procedure Code(s):        --- Professional ---                           [id number], GC, Colonoscopy, flexible; with removal of                            tumor(s), polyp(s), or other lesion(s) by snare                            technique                           23536, 8, Colonoscopy, flexible; with biopsy,                            single or multiple Diagnosis Code(s):        --- Professional ---                           Z12.11, Encounter for screening for malignant  neoplasm of colon                           K62.1, Rectal polyp                           K63.5, Polyp of colon CPT copyright 2019 American Medical Association. All rights reserved. The codes documented in this report are preliminary and upon coder review may  be revised to meet current compliance requirements. [name], MD [name],  06/11/2020 9:44:41 AM This report has been signed electronically. Number of Addenda: 0

## 2020-06-11 NOTE — Anesthesia Postprocedure Evaluation (Signed)
Anesthesia Post Note  Patient: Caleb Hunt  Procedure(s) Performed: COLONOSCOPY WITH PROPOFOL (N/A ) POLYPECTOMY  Patient location during evaluation: Phase II Anesthesia Type: General Level of consciousness: awake, oriented, awake and alert and patient cooperative Pain management: satisfactory to patient Vital Signs Assessment: post-procedure vital signs reviewed and stable Respiratory status: spontaneous breathing, respiratory function stable and nonlabored ventilation Cardiovascular status: stable Postop Assessment: no apparent nausea or vomiting Anesthetic complications: no   No complications documented.   Last Vitals:  Vitals:   06/11/20 0817 06/11/20 0941  BP: 111/75 94/65  Pulse: 65   Resp: (!) 24 12  Temp: 36.6 C 36.4 C  SpO2:  94%    Last Pain:  Vitals:   06/11/20 0941  TempSrc: Oral  PainSc: 0-No pain                 Damonie Furney

## 2020-06-11 NOTE — Anesthesia Preprocedure Evaluation (Signed)
Anesthesia Evaluation  Patient identified by MRN, date of birth, ID band Patient awake    Reviewed: Allergy & Precautions, H&P , NPO status , Patient's Chart, lab work & pertinent test results, reviewed documented beta blocker date and time   Airway Mallampati: II  TM Distance: >3 FB Neck ROM: full    Dental no notable dental hx.    Pulmonary neg pulmonary ROS, Current Smoker,    Pulmonary exam normal breath sounds clear to auscultation       Cardiovascular Exercise Tolerance: Good negative cardio ROS   Rhythm:regular Rate:Normal     Neuro/Psych negative neurological ROS  negative psych ROS   GI/Hepatic negative GI ROS, (+) Hepatitis -, C  Endo/Other  negative endocrine ROS  Renal/GU negative Renal ROS  negative genitourinary   Musculoskeletal   Abdominal   Peds  Hematology negative hematology ROS (+)   Anesthesia Other Findings   Reproductive/Obstetrics negative OB ROS                             Anesthesia Physical Anesthesia Plan  ASA: III  Anesthesia Plan: General   Post-op Pain Management:    Induction:   PONV Risk Score and Plan: Propofol infusion  Airway Management Planned:   Additional Equipment:   Intra-op Plan:   Post-operative Plan:   Informed Consent: I have reviewed the patients History and Physical, chart, labs and discussed the procedure including the risks, benefits and alternatives for the proposed anesthesia with the patient or authorized representative who has indicated his/her understanding and acceptance.     Dental Advisory Given  Plan Discussed with: CRNA  Anesthesia Plan Comments:         Anesthesia Quick Evaluation

## 2020-06-11 NOTE — Transfer of Care (Signed)
Immediate Anesthesia Transfer of Care Note  Patient: Caleb Hunt  Procedure(s) Performed: COLONOSCOPY WITH PROPOFOL (N/A ) POLYPECTOMY  Patient Location: PACU  Anesthesia Type:General  Level of Consciousness: awake, alert , oriented and patient cooperative  Airway & Oxygen Therapy: Patient Spontanous Breathing  Post-op Assessment: Report given to RN, Post -op Vital signs reviewed and stable and Patient moving all extremities X 4  Post vital signs: Reviewed and stable  Last Vitals:  Vitals Value Taken Time  BP 94/65 06/11/20 0941  Temp 36.4 C 06/11/20 0941  Pulse    Resp 12 06/11/20 0941  SpO2 94 % 06/11/20 0941    Last Pain:  Vitals:   06/11/20 0941  TempSrc: Oral  PainSc: 0-No pain      Patients Stated Pain Goal: 6 (43/15/40 0867)  Complications: No complications documented.

## 2020-06-12 LAB — SURGICAL PATHOLOGY

## 2020-06-14 ENCOUNTER — Encounter (HOSPITAL_COMMUNITY): Payer: Self-pay | Admitting: Gastroenterology

## 2020-06-16 LAB — CBC WITH DIFFERENTIAL/PLATELET
Basophils Absolute: 0.1 10*3/uL (ref 0.0–0.2)
Basos: 1 %
EOS (ABSOLUTE): 0.2 10*3/uL (ref 0.0–0.4)
Eos: 3 %
Hematocrit: 43.1 % (ref 37.5–51.0)
Hemoglobin: 15 g/dL (ref 13.0–17.7)
Immature Grans (Abs): 0 10*3/uL (ref 0.0–0.1)
Immature Granulocytes: 0 %
Lymphocytes Absolute: 1.8 10*3/uL (ref 0.7–3.1)
Lymphs: 28 %
MCH: 31.2 pg (ref 26.6–33.0)
MCHC: 34.8 g/dL (ref 31.5–35.7)
MCV: 90 fL (ref 79–97)
Monocytes Absolute: 0.5 10*3/uL (ref 0.1–0.9)
Monocytes: 7 %
Neutrophils Absolute: 3.9 10*3/uL (ref 1.4–7.0)
Neutrophils: 61 %
Platelets: 233 10*3/uL (ref 150–450)
RBC: 4.81 x10E6/uL (ref 4.14–5.80)
RDW: 12.8 % (ref 11.6–15.4)
WBC: 6.5 10*3/uL (ref 3.4–10.8)

## 2020-06-16 LAB — LIPID PANEL
Chol/HDL Ratio: 3.9 ratio (ref 0.0–5.0)
Cholesterol, Total: 158 mg/dL (ref 100–199)
HDL: 41 mg/dL (ref >39)
LDL Chol Calc (NIH): 91 mg/dL (ref 0–99)
Triglycerides: 151 mg/dL — ABNORMAL HIGH (ref 0–149)
VLDL Cholesterol Cal: 26 mg/dL (ref 5–40)

## 2020-06-16 LAB — CMP14+EGFR
ALT: 44 IU/L (ref 0–44)
AST: 33 IU/L (ref 0–40)
Albumin/Globulin Ratio: 2 (ref 1.2–2.2)
Albumin: 4.8 g/dL (ref 3.8–4.9)
Alkaline Phosphatase: 110 IU/L (ref 44–121)
BUN/Creatinine Ratio: 16 (ref 9–20)
BUN: 14 mg/dL (ref 6–24)
Bilirubin Total: 0.8 mg/dL (ref 0.0–1.2)
CO2: 24 mmol/L (ref 20–29)
Calcium: 9.6 mg/dL (ref 8.7–10.2)
Chloride: 100 mmol/L (ref 96–106)
Creatinine, Ser: 0.87 mg/dL (ref 0.76–1.27)
GFR calc Af Amer: 109 mL/min/{1.73_m2} (ref >59)
GFR calc non Af Amer: 94 mL/min/{1.73_m2} (ref >59)
Globulin, Total: 2.4 g/dL (ref 1.5–4.5)
Glucose: 105 mg/dL — ABNORMAL HIGH (ref 65–99)
Potassium: 4.3 mmol/L (ref 3.5–5.2)
Sodium: 136 mmol/L (ref 134–144)
Total Protein: 7.2 g/dL (ref 6.0–8.5)

## 2020-06-16 LAB — VITAMIN D 1,25 DIHYDROXY
Vitamin D 1, 25 (OH)2 Total: 75 pg/mL — ABNORMAL HIGH
Vitamin D2 1, 25 (OH)2: <10 pg/mL
Vitamin D3 1, 25 (OH)2: 74 pg/mL

## 2020-06-16 LAB — URINALYSIS, ROUTINE W REFLEX MICROSCOPIC
Bilirubin, UA: NEGATIVE
Glucose, UA: NEGATIVE
Ketones, UA: NEGATIVE
Leukocytes,UA: NEGATIVE
Nitrite, UA: NEGATIVE
Protein,UA: NEGATIVE
RBC, UA: NEGATIVE
Specific Gravity, UA: 1.005 (ref 1.005–1.030)
Urobilinogen, Ur: 0.2 mg/dL (ref 0.2–1.0)
pH, UA: 7.5 (ref 5.0–7.5)

## 2020-06-16 LAB — HEMOGLOBIN A1C
Est. average glucose Bld gHb Est-mCnc: 120 mg/dL
Hgb A1c MFr Bld: 5.8 % — ABNORMAL HIGH (ref 4.8–5.6)

## 2020-06-16 LAB — T4 AND TSH
T4, Total: 8.2 ug/dL (ref 4.5–12.0)
TSH: 4.15 u[IU]/mL (ref 0.450–4.500)

## 2020-06-20 ENCOUNTER — Telehealth (INDEPENDENT_AMBULATORY_CARE_PROVIDER_SITE_OTHER): Payer: Self-pay | Admitting: Gastroenterology

## 2020-06-20 NOTE — Telephone Encounter (Signed)
Spoke with the patient today regarding the results of his recent blood testing.  We will need to start at 12-week course of Harvoni daily for treatment of his hepatitis C genotype Ia.  The patient understood and agreed.  [name], please start the prior authorization process for this medication.  Also, I discussed with the patient that 2 tubular adenomas were found in his recent colonoscopy, rest of polyps removed were normal mucosa or hyperplastic polyps.  We will need to repeat a colonoscopy in 5 years.  Caleb Peppers, MD Gastroenterology and Hepatology [facility]

## 2020-06-21 NOTE — Telephone Encounter (Signed)
Forms filled out for the Harvoni to send to [employer] to get authorization. I have given the paper chart to Lelon Frohlich to print medical records so I may send this information with it. Will await forms and will fax to initiate.

## 2020-06-24 ENCOUNTER — Encounter (INDEPENDENT_AMBULATORY_CARE_PROVIDER_SITE_OTHER): Payer: Self-pay | Admitting: *Deleted

## 2020-06-24 NOTE — Telephone Encounter (Signed)
Received the Authorization form from [employer] filled out, Dr. Jenetta Downer signed. I faxed the form with medical records to them at [phone]. CLS 06/24/2020.

## 2020-06-24 NOTE — Telephone Encounter (Signed)
5 yr TCS noted in recall 

## 2020-06-25 ENCOUNTER — Other Ambulatory Visit (INDEPENDENT_AMBULATORY_CARE_PROVIDER_SITE_OTHER): Payer: Self-pay | Admitting: Gastroenterology

## 2020-06-25 ENCOUNTER — Other Ambulatory Visit (INDEPENDENT_AMBULATORY_CARE_PROVIDER_SITE_OTHER): Payer: Self-pay

## 2020-06-25 DIAGNOSIS — B182 Chronic viral hepatitis C: Secondary | ICD-10-CM

## 2020-06-25 MED ORDER — LEDIPASVIR-SOFOSBUVIR 90-400 MG PO TABS: 1.0000 | ORAL_TABLET | Freq: Every day | ORAL | 0 refills | Status: AC

## 2020-06-25 NOTE — Telephone Encounter (Signed)
Thanks, medication sent to pharmacy  [name], MD Gastroenterology and Hepatology [facility]

## 2020-06-25 NOTE — Telephone Encounter (Signed)
Per Claiborne Billings with [facility] send the rx for Ledipasvir-SOFOSBUVIR 90-400 mg one po QD for 12 weeks to them and they will forward the rx to their speciality pharmacy and the will contact the patient to have it delivered to his home. The patient is aware his medication was approved.

## 2020-06-25 NOTE — Telephone Encounter (Signed)
Patient is aware the medication has been sent to the local pharmacy [facility] and they will send to their Specialty pharmacy and notify him of the shipment to the office or to his home. He is also aware Dr. Jenetta Downer wanted to see him in December and the first available appointment is 08/01/2020 @ 9:30 am patient agreed to come to this appointment.

## 2020-06-25 NOTE — Telephone Encounter (Signed)
Per [employer] the patient has been approved for generic Harvoni/ Ledipasvir-Sofosbuvir 90 mg /400 mg one po daily for 12 weeks between 06/25/2020-09/17/2020.  I spoke with Elta Guadeloupe with [employer] he states patient can use a regular [facility] or a Therapist, nutritional.

## 2020-06-25 NOTE — Telephone Encounter (Signed)
Ok, I will make a note to Caleb Hunt to place in December 2021 schedule as she is moving around the schedule. Please send in the prescription to [facility]. Thanks

## 2020-06-25 NOTE — Telephone Encounter (Signed)
Excellent, thanks so much.  He needs a follow up appointment the first week of December.  [name], MD Gastroenterology and Hepatology [facility]

## 2020-06-27 NOTE — Telephone Encounter (Signed)
[facility] and spoke with Cheslea she states they have forwarded this request to the specialty pharmacy and once it is processed they will notify the office.

## 2020-06-27 NOTE — Telephone Encounter (Signed)
I spoke with the patient and make him aware that the medication was approved through 09/17/2020, and the drug store will be reaching out to him to go over benefits and I made the patient aware that the medication was requested to be mailed to the office and not his home. I gave the patient the phone number they provided at [phone] if he does not hear from them. CLS 06/27/2020

## 2020-07-02 NOTE — Telephone Encounter (Signed)
I called the patient and gave patient the phone number to [employer] at [phone] opt 2. I asked that the patient please call them today on his lunch break to get this started. He states he will. CLS 07/02/2020

## 2020-07-02 NOTE — Telephone Encounter (Signed)
Per Herschell Dimes with [employer] they are handling patients rx for the (Harvoni) Ledipasvir 90 mg/sofosbuvir 400 mg # 28 with two refills. No other pharmacy should be involved. Per Sherren Mocha they have reached out to the patient to discuss the copay or assistance copay card which should cover this,but the patient has not reached back out to them.

## 2020-07-02 NOTE — Telephone Encounter (Signed)
I spoke with Nerly at [facility] [phone] opt 2. She will have the patients medication sent to the office on 07/04/2020.

## 2020-07-04 ENCOUNTER — Other Ambulatory Visit (INDEPENDENT_AMBULATORY_CARE_PROVIDER_SITE_OTHER): Payer: Self-pay

## 2020-07-04 DIAGNOSIS — B182 Chronic viral hepatitis C: Secondary | ICD-10-CM

## 2020-07-04 NOTE — Telephone Encounter (Signed)
Labs placed in Epic. Orders placed with the medication for pick up with date of draw needed on 08/02/2020.

## 2020-07-04 NOTE — Telephone Encounter (Signed)
Patient is aware his medication has arrived at the office. He is aware he needs to take daily, with no missed doses. He states he will send his wife Caleb Hunt here to the office today to pick up the medication. He states he will start the medication on 07/06/2020. He is aware he will have to have labs drawn four weeks after starting the medication around 08/02/2020 at [facility] lab.

## 2020-07-05 NOTE — Telephone Encounter (Signed)
Patient called back per Mitzie and states he will be sending his wife to pick up the medication today 07/05/2020, and he will be starting the medication on Monday. The lab note change to the patient having labs drawn around December 13,2021.

## 2020-07-05 NOTE — Telephone Encounter (Signed)
Patient wife Janett Billow came by the office and picked up the medication. I advised her that he needed to let me know for sure which date he was going to start the medication. Gave her the lab order that the patient will need to have done around 08/05/2020.

## 2020-07-05 NOTE — Telephone Encounter (Signed)
I called and left a detailed message that if he plans on starting the medication tomorrow 07/06/2020 patient needed to arrange for the medication to be picked up from the office today 07/05/2020.

## 2020-08-01 ENCOUNTER — Encounter (INDEPENDENT_AMBULATORY_CARE_PROVIDER_SITE_OTHER): Payer: Self-pay | Admitting: Gastroenterology

## 2020-08-01 ENCOUNTER — Other Ambulatory Visit: Payer: Self-pay

## 2020-08-01 ENCOUNTER — Telehealth (INDEPENDENT_AMBULATORY_CARE_PROVIDER_SITE_OTHER): Payer: No Typology Code available for payment source | Admitting: Gastroenterology

## 2020-08-01 VITALS — Ht 69.0 in | Wt 226.0 lb

## 2020-08-01 DIAGNOSIS — B182 Chronic viral hepatitis C: Secondary | ICD-10-CM | POA: Diagnosis not present

## 2020-08-01 NOTE — Patient Instructions (Signed)
-   Complete Harvoni 12 week course - Check HCV RNA and CMP on Monday - Repeat colonoscopy 5 years

## 2020-08-01 NOTE — Progress Notes (Signed)
[name], M.D. Gastroenterology & Hepatology [facility] [address] [place], [place] [address] Primary Care Physician: Lindell Spar, MD [address] [place] [address]  This is a phone visit.  It required patient-provider interaction for the medical decision making as documented below. The patient has consented and agreed to proceed with a Telehealth encounter given the current Coronavirus pandemic.  VIRTUAL VISIT NOTE Patient location: Work Provider location: Office  I will communicate my assessment and recommendations to the referring MD via EMR. Note: Occasional unusual wording and randomly placed punctuation marks may result from the use of speech recognition technology to transcribe this document"  Problems: 1. Chronic hepatitis C genotype 1a  History of Present Illness: Caleb Hunt is a 59 y.o. male with past medical history of drug abuse in remission and hepatitis C genotype 1a on Harvoni, who presents for follow up of hepatitis C.  The patient was last seen on 05/22/2020.  Patient underwent blood testing for evaluation of his hepatitis C before treatment, he was found to have negative testing for HIV and hepatitis B.  Due to these, he was started on Harvoni for 12 weeks.  The patient reports that he has taken 25 days of his medication. Has not missed any doses has been completely compliant with.  States for the first two days of taking the medication he felt some fatigue but this has resolved. The patient denies having any nausea, vomiting, fever, chills, hematochezia, melena, hematemesis, abdominal distention, abdominal pain, diarrhea, jaundice, pruritus or weight loss.  Patient states that he will have his blood drawn on Monday and his wife will pick up his medication tomorrow.  Last Colonoscopy: 06/11/2020 - five polyps (TC, AC, DC and rectum) were removed, max size of polyp 8 mm - two tubular adenomas,  one hyperplastic polyp, rest was normal mucosa - repeat in 5 years  Past Medical History: Past Medical History:  Diagnosis Date  . Hep C w/o coma, chronic (HCC)     Past Surgical History: Past Surgical History:  Procedure Laterality Date  . CHOLECYSTECTOMY    . COLONOSCOPY WITH PROPOFOL N/A 06/11/2020   Procedure: COLONOSCOPY WITH PROPOFOL;  Surgeon: Harvel Quale, MD;  Location: [facility];  Service: Gastroenterology;  Laterality: N/A;  945  . POLYPECTOMY  06/11/2020   Procedure: POLYPECTOMY;  Surgeon: Harvel Quale, MD;  Location: [facility];  Service: Gastroenterology;;    Family History: Family History  Problem Relation Age of Onset  . Diabetes Mother   . Diabetes Father   . Heart disease Father   . Hepatitis C Father   . Diabetes Sister   . Psoriasis Sister     Social History: Social History   Tobacco Use  Smoking Status Current Every Day Smoker  . Packs/day: 0.25  . Types: Cigarettes  Smokeless Tobacco Never Used  Tobacco Comment   Patient states he smokes one cigarette daily   Social History   Substance and Sexual Activity  Alcohol Use Not Currently   Social History   Substance and Sexual Activity  Drug Use No    Allergies: No Known Allergies  Medications: Current Outpatient Medications  Medication Sig Dispense Refill  . Ledipasvir-Sofosbuvir 90-400 MG TABS Take 1 tablet by mouth daily. 84 tablet 0  . MILK THISTLE PO Take 1 capsule by mouth daily.    . Multiple Vitamins-Minerals (MULTIVITAMIN WITH MINERALS) tablet Take 1 tablet by mouth daily.    . sildenafil (  VIAGRA) 100 MG tablet Take 1 tablet (100 mg total) by mouth daily as needed for erectile dysfunction. 10 tablet 5  . Efinaconazole 10 % SOLN Apply 1 application topically daily. (Patient not taking: No sig reported) 8 mL 1   No current facility-administered medications for this visit.    Review of Systems: GENERAL: negative for malaise, night sweats HEENT:  No changes in hearing or vision, no nose bleeds or other nasal problems. NECK: Negative for lumps, goiter, pain and significant neck swelling RESPIRATORY: Negative for cough, wheezing CARDIOVASCULAR: Negative for chest pain, leg swelling, palpitations, orthopnea GI: SEE HPI MUSCULOSKELETAL: Negative for joint pain or swelling, back pain, and muscle pain. SKIN: Negative for lesions, rash PSYCH: Negative for sleep disturbance, mood disorder and recent psychosocial stressors. HEMATOLOGY Negative for prolonged bleeding, bruising easily, and swollen nodes. ENDOCRINE: Negative for cold or heat intolerance, polyuria, polydipsia and goiter. NEURO: negative for tremor, gait imbalance, syncope and seizures. The remainder of the review of systems is noncontributory.   Physical Exam: Unable to perform as this was a phone encounter  Imaging/Labs: as above  I personally reviewed and interpreted the available labs, imaging and endoscopic files.  Impression and Plan: Caleb Hunt is a 59 y.o. male with past medical history of drug abuse in remission and hepatitis C genotype 1a on Harvoni, who presents for follow up of hepatitis C. the patient has tolerated adequately his medication.  Has not been taking any medication to reduce the acid reflux which could potentially interfere with his Harvoni treatment.  Will need to repeat HCVRNA and CMP on Monday.  I explained to the patient that it was extremely important for him to be compliant with his Harvoni and take it on a daily basis until finishing his course.  He will need to follow-up in 2 months after finishing his medication.  On the other hand, he will need a repeat colonoscopy in 5 years for surveillance of colonic polyps.  Patient understood and agreed.  - Complete Harvoni 12 week course - Check HCV RNA and CMP on Monday - Repeat colonoscopy 5 years - RTC in 2 months  All questions were answered.      Time spent in call: I spent a total of 15  minutes  [name], MD Gastroenterology and Hepatology [facility]

## 2020-08-09 LAB — COMPREHENSIVE METABOLIC PANEL
AG Ratio: 1.8 (calc) (ref 1.0–2.5)
ALT: 16 U/L (ref 9–46)
AST: 18 U/L (ref 10–35)
Albumin: 4.6 g/dL (ref 3.6–5.1)
Alkaline phosphatase (APISO): 94 U/L (ref 35–144)
BUN: 19 mg/dL (ref 7–25)
CO2: 27 mmol/L (ref 20–32)
Calcium: 9.7 mg/dL (ref 8.6–10.3)
Chloride: 103 mmol/L (ref 98–110)
Creat: 0.85 mg/dL (ref 0.70–1.33)
Globulin: 2.6 g/dL (calc) (ref 1.9–3.7)
Glucose, Bld: 94 mg/dL (ref 65–99)
Potassium: 4.3 mmol/L (ref 3.5–5.3)
Sodium: 139 mmol/L (ref 135–146)
Total Bilirubin: 0.7 mg/dL (ref 0.2–1.2)
Total Protein: 7.2 g/dL (ref 6.1–8.1)

## 2020-08-09 LAB — HEPATITIS C RNA QUANTITATIVE
HCV Quantitative Log: <1.18 log IU/mL
HCV RNA, PCR, QN: <15 IU/mL

## 2020-09-02 ENCOUNTER — Telehealth (INDEPENDENT_AMBULATORY_CARE_PROVIDER_SITE_OTHER): Payer: Self-pay

## 2020-09-02 NOTE — Telephone Encounter (Signed)
Wife picked up the medication today from the office.

## 2020-09-02 NOTE — Telephone Encounter (Signed)
Thanks

## 2020-09-19 ENCOUNTER — Other Ambulatory Visit: Payer: No Typology Code available for payment source

## 2020-09-19 DIAGNOSIS — Z20822 Contact with and (suspected) exposure to covid-19: Secondary | ICD-10-CM

## 2020-09-20 LAB — SARS-COV-2, NAA 2 DAY TAT

## 2020-09-20 LAB — NOVEL CORONAVIRUS, NAA: SARS-CoV-2, NAA: DETECTED — AB

## 2020-09-23 ENCOUNTER — Telehealth: Payer: Self-pay

## 2020-09-23 ENCOUNTER — Telehealth (INDEPENDENT_AMBULATORY_CARE_PROVIDER_SITE_OTHER): Payer: Self-pay

## 2020-09-23 NOTE — Telephone Encounter (Signed)
PT is calling to advise he is covid +, and is doing well

## 2020-09-23 NOTE — Telephone Encounter (Signed)
I called the patient because he is due to finish his Hep C medication on 09/30/2019. He has an appointment here for a follow up on 10/09/2020 to follow up on this. He states he just lost his mother on 09/14/2020 due to Covid and his whole family is Covid positive. He states he is much better,but wife is struggling and he is home with her today incase he has to take her to the Pcp or Ed. I advised that I was sorry to hear of his families condition and would keep in prayer. He is aware of his appt w [place] on 10/09/2020. He is having no issues currently.

## 2020-09-29 NOTE — Telephone Encounter (Signed)
Thanks [name] If needed, he can do a virtual appointment if he can't make it or due to COVID status.  Thanks

## 2020-10-09 ENCOUNTER — Ambulatory Visit (INDEPENDENT_AMBULATORY_CARE_PROVIDER_SITE_OTHER): Payer: No Typology Code available for payment source | Admitting: Gastroenterology

## 2020-10-09 ENCOUNTER — Other Ambulatory Visit: Payer: Self-pay

## 2020-10-09 ENCOUNTER — Encounter (INDEPENDENT_AMBULATORY_CARE_PROVIDER_SITE_OTHER): Payer: Self-pay | Admitting: Gastroenterology

## 2020-10-09 VITALS — BP 132/82 | HR 77 | Temp 97.9°F | Ht 69.0 in | Wt 225.0 lb

## 2020-10-09 DIAGNOSIS — B182 Chronic viral hepatitis C: Secondary | ICD-10-CM

## 2020-10-09 NOTE — Progress Notes (Signed)
[name], M.D. Gastroenterology & Hepatology [facility] [address] [place], [place] [address] Primary Care Physician: Lindell Spar, MD [address] [place] [address]  Problems: 1. Chronic hepatitis C genotype 1a status post Harvoni  History of Present Illness: Caleb Hunt is a 60 y.o. male with past medical history of drug abuse in remission and hepatitis C genotype 1a on Harvoni, who presents for follow up of hepatitis C.  Patient states feeling well and denies having any complaints. He finished his medication, states being compliant with the medication, did not miss any doses. Patient states that he has felt more energetic and active after finishing his Harvoni. The patient denies having any nausea, vomiting, fever, chills, hematochezia, melena, hematemesis, abdominal distention, abdominal pain, diarrhea, jaundice, pruritus or weight loss.   Last Colonoscopy: 06/11/2020 - five polyps (TC, AC, DC and rectum) were removed, max size of polyp 8 mm - two tubular adenomas, one hyperplastic polyp, rest was normal mucosa - repeat in 5 years  Past Medical History: Past Medical History:  Diagnosis Date  . Hep C w/o coma, chronic (HCC)     Past Surgical History: Past Surgical History:  Procedure Laterality Date  . CHOLECYSTECTOMY    . COLONOSCOPY WITH PROPOFOL N/A 06/11/2020   Procedure: COLONOSCOPY WITH PROPOFOL;  Surgeon: Harvel Quale, MD;  Location: [facility];  Service: Gastroenterology;  Laterality: N/A;  945  . POLYPECTOMY  06/11/2020   Procedure: POLYPECTOMY;  Surgeon: Harvel Quale, MD;  Location: [facility];  Service: Gastroenterology;;    Family History: Family History  Problem Relation Age of Onset  . Diabetes Mother   . Diabetes Father   . Heart disease Father   . Hepatitis C Father   . Diabetes Sister   . Psoriasis Sister     Social History: Social History    Tobacco Use  Smoking Status Current Every Day Smoker  . Packs/day: 0.25  . Types: Cigarettes  Smokeless Tobacco Never Used  Tobacco Comment   Patient states he smokes one cigarette daily   Social History   Substance and Sexual Activity  Alcohol Use Not Currently   Social History   Substance and Sexual Activity  Drug Use No    Allergies: No Known Allergies  Medications: Current Outpatient Medications  Medication Sig Dispense Refill  . MILK THISTLE PO Take 1 capsule by mouth daily.    . Multiple Vitamins-Minerals (MULTIVITAMIN WITH MINERALS) tablet Take 1 tablet by mouth daily.    . Efinaconazole 10 % SOLN Apply 1 application topically daily. (Patient not taking: No sig reported) 8 mL 1  . sildenafil (VIAGRA) 100 MG tablet Take 1 tablet (100 mg total) by mouth daily as needed for erectile dysfunction. (Patient not taking: Reported on 10/09/2020) 10 tablet 5   No current facility-administered medications for this visit.    Review of Systems: GENERAL: negative for malaise, night sweats HEENT: No changes in hearing or vision, no nose bleeds or other nasal problems. NECK: Negative for lumps, goiter, pain and significant neck swelling RESPIRATORY: Negative for cough, wheezing CARDIOVASCULAR: Negative for chest pain, leg swelling, palpitations, orthopnea GI: SEE HPI MUSCULOSKELETAL: Negative for joint pain or swelling, back pain, and muscle pain. SKIN: Negative for lesions, rash PSYCH: Negative for sleep disturbance, mood disorder and recent psychosocial stressors. HEMATOLOGY Negative for prolonged bleeding, bruising easily, and swollen nodes. ENDOCRINE: Negative for cold or heat intolerance, polyuria, polydipsia and goiter. NEURO: negative for tremor,  gait imbalance, syncope and seizures. The remainder of the review of systems is noncontributory.   Physical Exam: BP 132/82 (BP Location: Left Arm, Patient Position: Sitting, Cuff Size: Large)   Pulse 77   Temp 97.9 F  (36.6 C) (Oral)   Ht 5\' 9"  (1.753 m)   Wt 225 lb (102.1 kg)   BMI 33.23 kg/m  GENERAL: The patient is AO x3, in no acute distress. HEENT: Head is normocephalic and atraumatic. EOMI are intact. Mouth is well hydrated and without lesions. NECK: Supple. No masses LUNGS: Clear to auscultation. No presence of rhonchi/wheezing/rales. Adequate chest expansion HEART: RRR, normal s1 and s2. ABDOMEN: Soft, nontender, no guarding, no peritoneal signs, and nondistended. BS +. No masses. EXTREMITIES: Without any cyanosis, clubbing, rash, lesions or edema. NEUROLOGIC: AOx3, no focal motor deficit. SKIN: no jaundice, no rashes   Imaging/Labs: as above  I personally reviewed and interpreted the available labs, imaging and endoscopic files.  Impression and Plan: Caleb Hunt is a 60 y.o. male with past medical history of drug abuse in remission and hepatitis C genotype 1a on Harvoni, who presents for follow up of hepatitis C. the patient has successfully finished his antiviral course and tolerated medication adequately without having any significant side effects.  At the moment, we'll need to check his HCVRNA and CMP to address if he achieved SVR; if he achieved SVR, he will be considered cured and no further action will be warranted for this.  Nevertheless, given his body habitus, family history of liver cancer and the chronic history of hepatitis C, will obtain a liver elastography at this point.  Patient understood and agreed.  - Schedule liver elastography -Check CBC and CMP All questions were answered.      [name], MD Gastroenterology and Hepatology [facility]

## 2020-10-09 NOTE — Patient Instructions (Signed)
Schedule liver elastography Perform blood workup

## 2020-10-13 LAB — COMPREHENSIVE METABOLIC PANEL
AG Ratio: 1.8 (calc) (ref 1.0–2.5)
ALT: 13 U/L (ref 9–46)
AST: 16 U/L (ref 10–35)
Albumin: 4.6 g/dL (ref 3.6–5.1)
Alkaline phosphatase (APISO): 100 U/L (ref 35–144)
BUN: 19 mg/dL (ref 7–25)
CO2: 27 mmol/L (ref 20–32)
Calcium: 9.6 mg/dL (ref 8.6–10.3)
Chloride: 102 mmol/L (ref 98–110)
Creat: 1.14 mg/dL (ref 0.70–1.33)
Globulin: 2.5 g/dL (calc) (ref 1.9–3.7)
Glucose, Bld: 105 mg/dL (ref 65–139)
Potassium: 4.6 mmol/L (ref 3.5–5.3)
Sodium: 138 mmol/L (ref 135–146)
Total Bilirubin: 0.5 mg/dL (ref 0.2–1.2)
Total Protein: 7.1 g/dL (ref 6.1–8.1)

## 2020-10-13 LAB — HCV RNA, QUANT REAL-TIME PCR W/REFLEX
HCV RNA, PCR, QN (Log): <1.18 LogIU/mL
HCV RNA, PCR, QN: <15 IU/mL

## 2020-10-14 ENCOUNTER — Ambulatory Visit (HOSPITAL_COMMUNITY)
Admission: RE | Admit: 2020-10-14 | Discharge: 2020-10-14 | Disposition: A | Discharge status: Home / Self Care | Payer: No Typology Code available for payment source | Source: Ambulatory Visit | Attending: Gastroenterology | Admitting: Gastroenterology

## 2020-10-14 ENCOUNTER — Other Ambulatory Visit: Payer: Self-pay

## 2020-10-14 DIAGNOSIS — B182 Chronic viral hepatitis C: Secondary | ICD-10-CM | POA: Diagnosis not present

## 2020-12-06 ENCOUNTER — Ambulatory Visit: Payer: No Typology Code available for payment source | Admitting: Internal Medicine

## 2020-12-13 ENCOUNTER — Ambulatory Visit: Payer: No Typology Code available for payment source | Admitting: Internal Medicine

## 2022-02-03 IMAGING — US US ABDOMEN LIMITED W/ ELASTOGRAPHY
1 series · 12 of 23 positions shown · non-contrast
Comparison: None.

CLINICAL DATA: Hepatitis.  Hepatitis.  Rule out cirrhosis or mass.

EXAM:
US ABDOMEN LIMITED - RIGHT UPPER QUADRANT
ULTRASOUND HEPATIC ELASTOGRAPHY
TECHNIQUE: Sonography of the right upper quadrant was performed. In addition,
ultrasound elastography evaluation of the liver was performed. A
region of interest was placed within the right lobe of the liver.
Following application of a compressive sonographic pulse, tissue
compressibility was assessed. Multiple assessments were performed at
the selected site. Median tissue compressibility was determined.
Previously, hepatic stiffness was assessed by shear wave velocity.
Based on recently published Society of Radiologists in Ultrasound
consensus article, reporting is now recommended to be performed in
the SI units of pressure (kiloPascals) representing hepatic
stiffness/elasticity. The obtained result is compared to the
published reference standards. (cACLD = compensated Advanced Chronic
Liver Disease)

[Series 1: us abdomen ruq w/elastography · 12 of 23 slices shown]
[im 1/23]
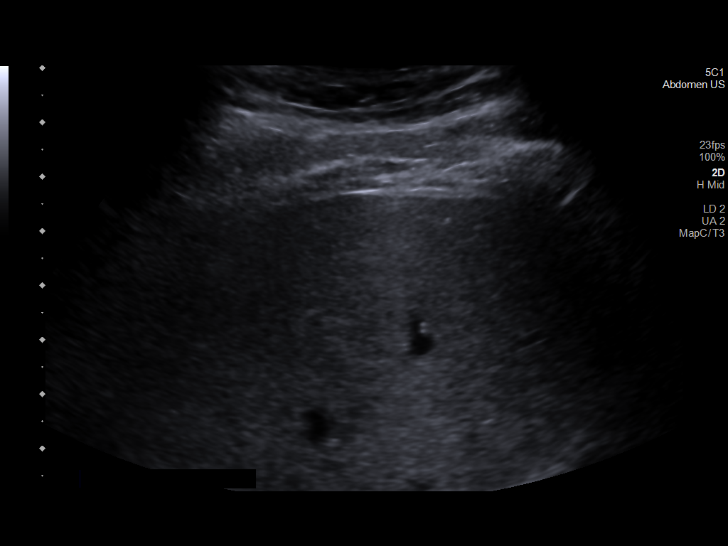
[im 3/23]
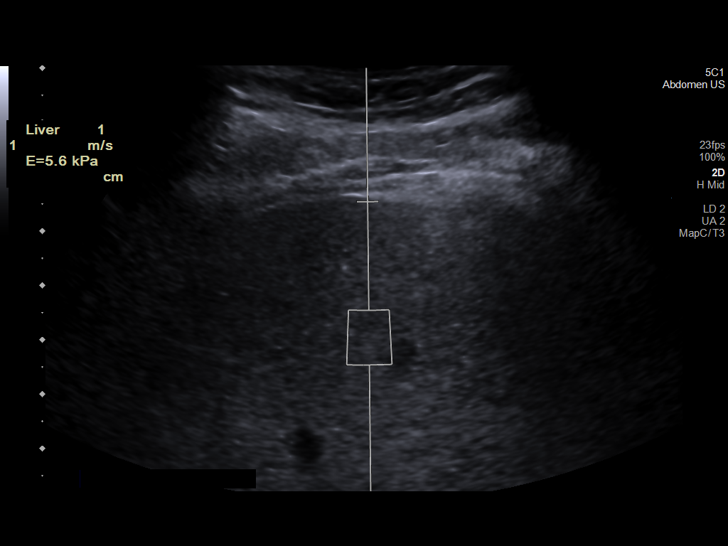
[im 5/23]
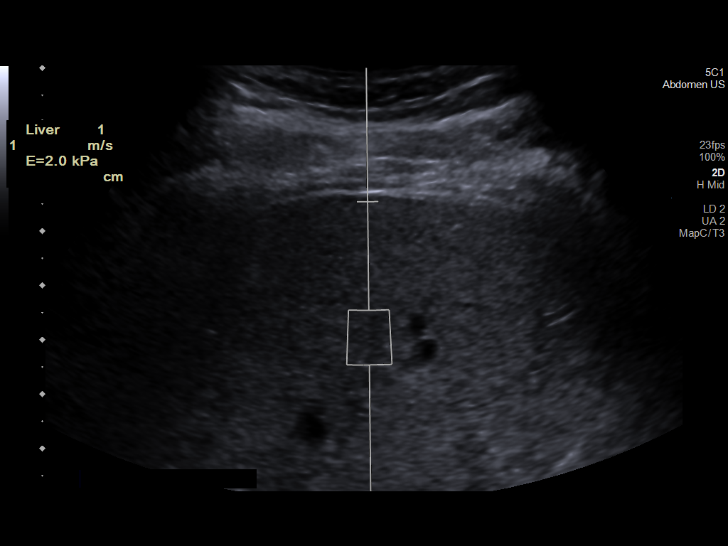
[im 7/23]
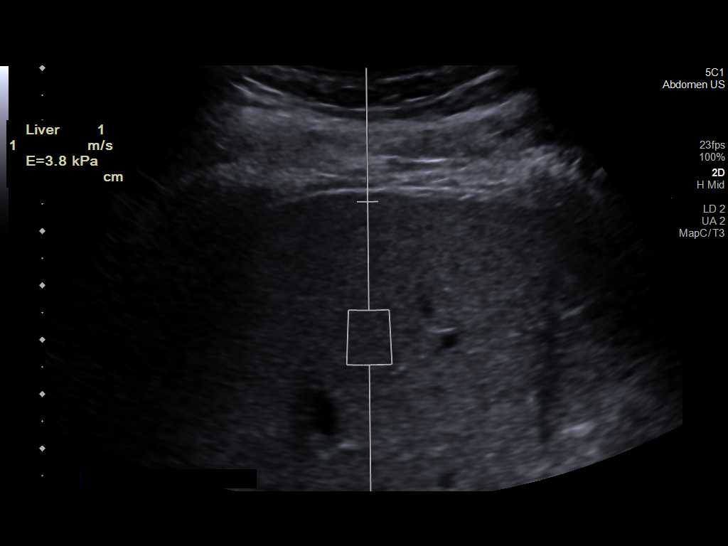
[im 9/23]
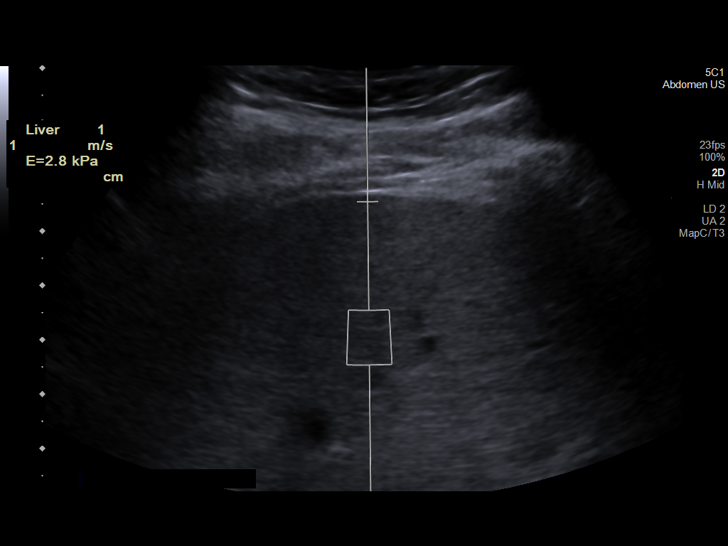
[im 11/23]
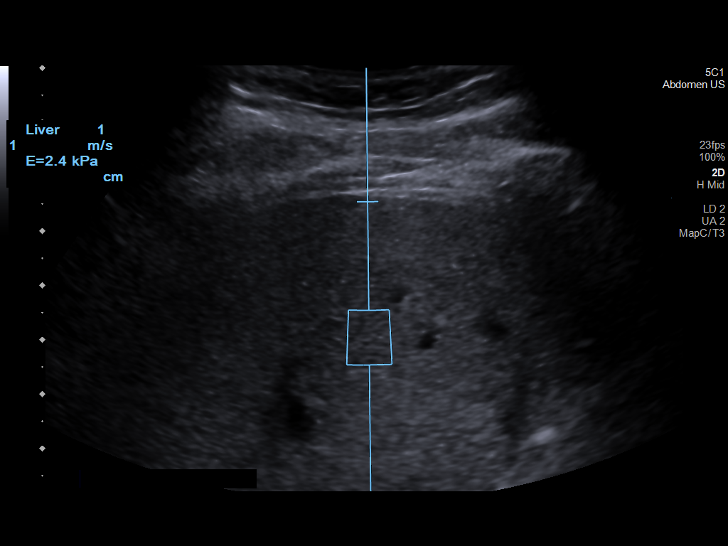
[im 13/23]
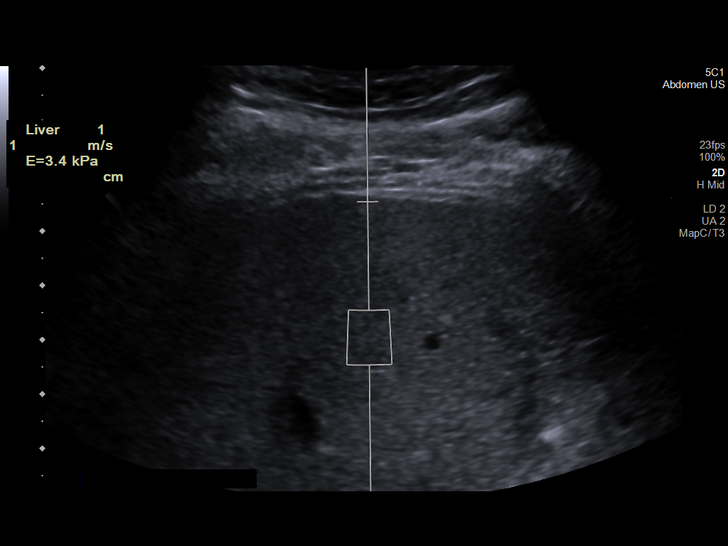
[im 15/23]
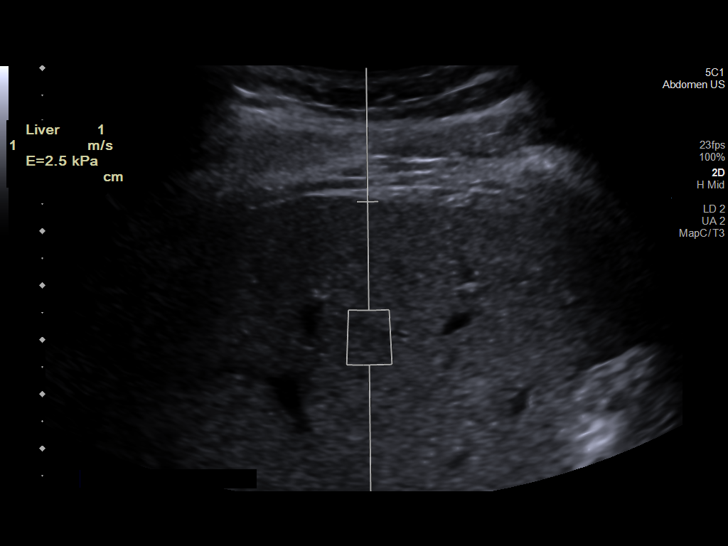
[im 17/23]
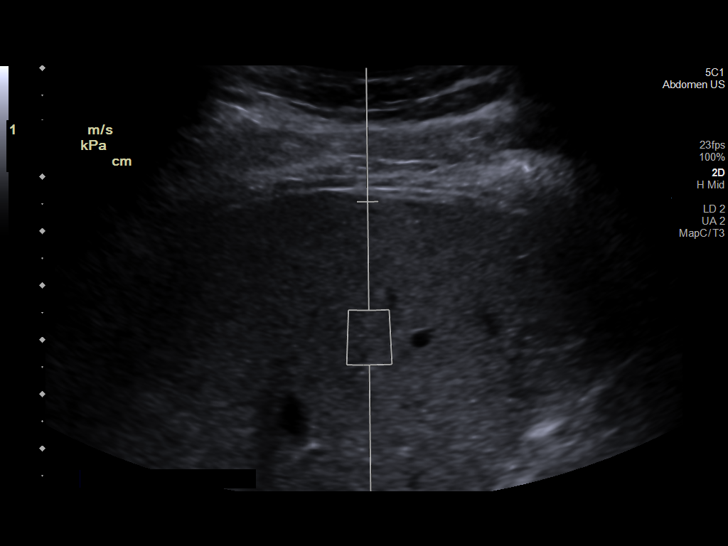
[im 19/23]
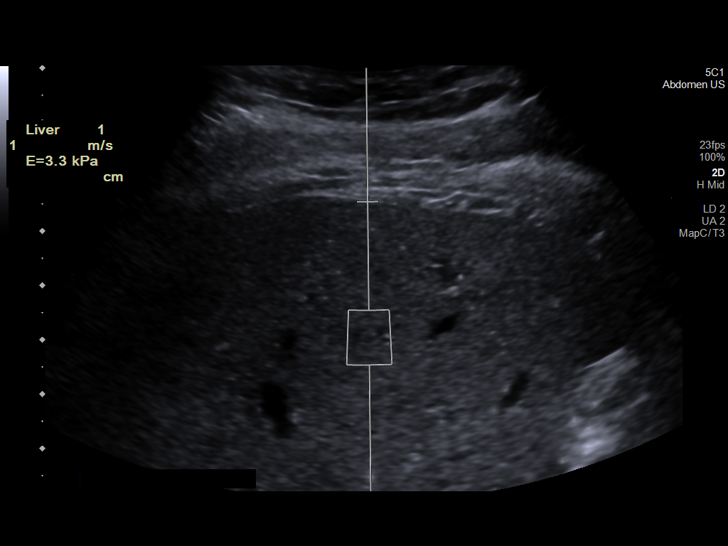
[im 21/23]
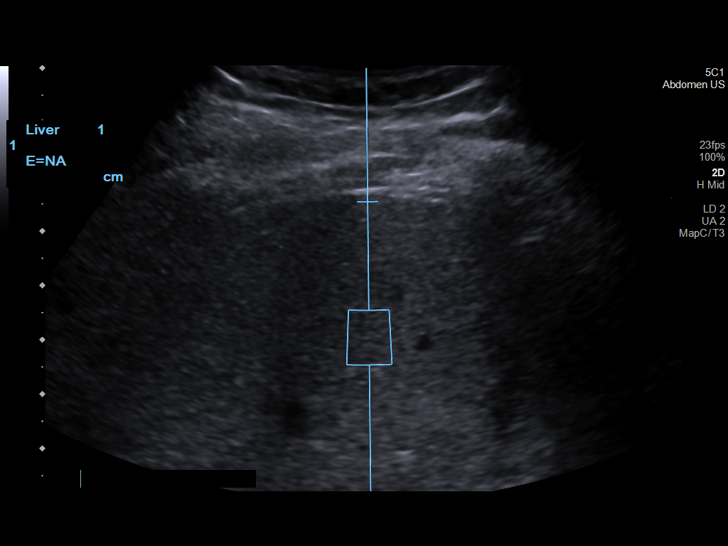
[im 23/23]
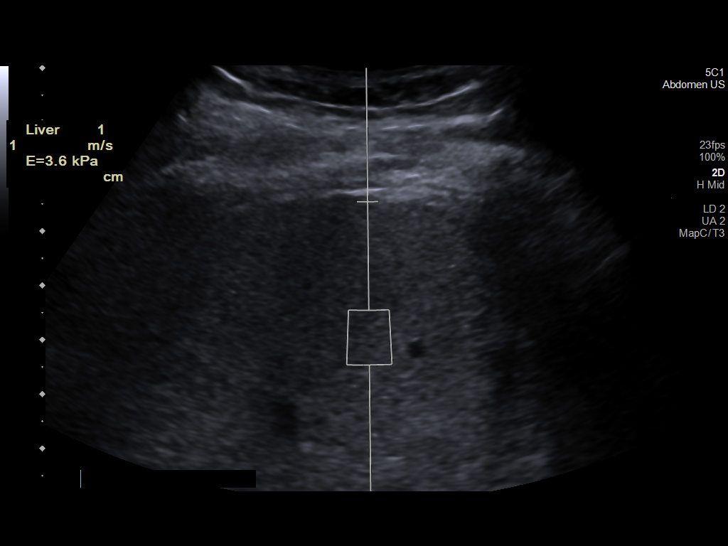

[12 of 23 positions shown; findings below may reference images not displayed]

FINDINGS: ULTRASOUND ABDOMEN LIMITED RIGHT UPPER QUADRANT

Gallbladder:

Surgically absent

Common bile duct:

Diameter: Normal, 4 mm.

Liver:

Normal echogenicity. No focal liver lesion. Portal vein is patent on
color Doppler imaging with normal direction of blood flow towards
the liver.

ULTRASOUND HEPATIC ELASTOGRAPHY

Device: Siemens Helix VTQ

Patient position: Supine

Transducer 5C1

Number of measurements: 10

Hepatic segment:  8

Median kPa:

IQR:

IQR/Median kPa ratio:

Data quality:  Good

Diagnostic category:  < or = 5 kPa: high probability of being normal

The use of hepatic elastography is applicable to patients with viral
hepatitis and non-alcoholic fatty liver disease. At this time, there
is insufficient data for the referenced cut-off values and use in
other causes of liver disease, including alcoholic liver disease.
Patients, however, may be assessed by elastography and serve as
their own reference standard/baseline.

In patients with non-alcoholic liver disease, the values suggesting
compensated advanced chronic liver disease (cACLD) may be lower, and
patients may need additional testing with elasticity results of [DATE]
kPa.

Please note that abnormal hepatic elasticity and shear wave
velocities may also be identified in clinical settings other than
with hepatic fibrosis, such as: acute hepatitis, elevated right
heart and central venous pressures including use of beta blockers,
Jevnika disease (Morla Sanchez), infiltrative processes such as
mastocytosis/amyloidosis/infiltrative tumor/lymphoma, extrahepatic
cholestasis, with hyperemia in the post-prandial state, and with
liver transplantation. Correlation with patient history, laboratory
data, and clinical condition recommended.

Diagnostic Categories:

< or =5 kPa: high probability of being normal

< or =9 kPa: in the absence of other known clinical signs, rules [DATE] kPa and ?13 kPa: suggestive of cACLD, but needs further testing

>13 kPa: highly suggestive of cACLD

> or =17 kPa: highly suggestive of cACLD with an increased
probability of clinically significant portal hypertension
IMPRESSION: ULTRASOUND RUQ:

Cholecystectomy.  Otherwise normal right upper quadrant ultrasound.

ULTRASOUND HEPATIC ELASTOGRAPHY:

Median kPa:

Diagnostic category:  < or = 5 kPa: high probability of being normal

## 2022-03-01 ENCOUNTER — Encounter (HOSPITAL_COMMUNITY): Payer: Self-pay | Admitting: Emergency Medicine

## 2022-03-01 ENCOUNTER — Emergency Department (HOSPITAL_COMMUNITY)
Admission: EM | Admit: 2022-03-01 | Discharge: 2022-03-01 | Disposition: A | Discharge status: Home / Self Care | Payer: PRIVATE HEALTH INSURANCE | Attending: Emergency Medicine | Admitting: Emergency Medicine

## 2022-03-01 ENCOUNTER — Emergency Department (HOSPITAL_COMMUNITY): Payer: PRIVATE HEALTH INSURANCE

## 2022-03-01 DIAGNOSIS — R000 Tachycardia, unspecified: Secondary | ICD-10-CM | POA: Diagnosis not present

## 2022-03-01 DIAGNOSIS — K625 Hemorrhage of anus and rectum: Secondary | ICD-10-CM

## 2022-03-01 DIAGNOSIS — R531 Weakness: Secondary | ICD-10-CM | POA: Diagnosis not present

## 2022-03-01 DIAGNOSIS — K5731 Diverticulosis of large intestine without perforation or abscess with bleeding: Secondary | ICD-10-CM | POA: Insufficient documentation

## 2022-03-01 DIAGNOSIS — K5790 Diverticulosis of intestine, part unspecified, without perforation or abscess without bleeding: Secondary | ICD-10-CM

## 2022-03-01 LAB — TYPE AND SCREEN
ABO/RH(D): O POS
Antibody Screen: NEGATIVE

## 2022-03-01 LAB — CBC WITH DIFFERENTIAL/PLATELET
Abs Immature Granulocytes: 0.02 10*3/uL (ref 0.00–0.07)
Basophils Absolute: 0 10*3/uL (ref 0.0–0.1)
Basophils Relative: 1 %
Eosinophils Absolute: 0.2 10*3/uL (ref 0.0–0.5)
Eosinophils Relative: 3 %
HCT: 43.3 % (ref 39.0–52.0)
Hemoglobin: 14.8 g/dL (ref 13.0–17.0)
Immature Granulocytes: 0 %
Lymphocytes Relative: 26 %
Lymphs Abs: 1.9 10*3/uL (ref 0.7–4.0)
MCH: 30.9 pg (ref 26.0–34.0)
MCHC: 34.2 g/dL (ref 30.0–36.0)
MCV: 90.4 fL (ref 80.0–100.0)
Monocytes Absolute: 0.6 10*3/uL (ref 0.1–1.0)
Monocytes Relative: 8 %
Neutro Abs: 4.7 10*3/uL (ref 1.7–7.7)
Neutrophils Relative %: 62 %
Platelets: 239 10*3/uL (ref 150–400)
RBC: 4.79 MIL/uL (ref 4.22–5.81)
RDW: 12.9 % (ref 11.5–15.5)
WBC: 7.5 10*3/uL (ref 4.0–10.5)
nRBC: 0 % (ref 0.0–0.2)

## 2022-03-01 LAB — COMPREHENSIVE METABOLIC PANEL
ALT: 22 U/L (ref 0–44)
AST: 28 U/L (ref 15–41)
Albumin: 4.1 g/dL (ref 3.5–5.0)
Alkaline Phosphatase: 80 U/L (ref 38–126)
Anion gap: 7 (ref 5–15)
BUN: 22 mg/dL — ABNORMAL HIGH (ref 6–20)
CO2: 24 mmol/L (ref 22–32)
Calcium: 8.8 mg/dL — ABNORMAL LOW (ref 8.9–10.3)
Chloride: 104 mmol/L (ref 98–111)
Creatinine, Ser: 0.82 mg/dL (ref 0.61–1.24)
GFR, Estimated: >60 mL/min (ref >60)
Glucose, Bld: 120 mg/dL — ABNORMAL HIGH (ref 70–99)
Potassium: 4.2 mmol/L (ref 3.5–5.1)
Sodium: 135 mmol/L (ref 135–145)
Total Bilirubin: 0.7 mg/dL (ref 0.3–1.2)
Total Protein: 7.2 g/dL (ref 6.5–8.1)

## 2022-03-01 LAB — PROTIME-INR
INR: 1 (ref 0.8–1.2)
Prothrombin Time: 12.7 seconds (ref 11.4–15.2)

## 2022-03-01 MED ORDER — IOHEXOL 300 MG/ML  SOLN
100.0000 mL | Freq: Once | INTRAMUSCULAR | Status: AC | PRN
  Administered 2022-03-01: 100 mL via INTRAVENOUS

## 2022-03-01 NOTE — Discharge Instructions (Signed)
Your CT scan showed diverticulosis.  This is frequently a cause of painless rectal bleeding.  This is typically something that resolves on its own.  Complications that can arise from this are severe bleeding and infection.  You should return to the emergency department if you do experience increased blood loss or symptoms of infection (abdominal pain, fevers, chills, nausea, vomiting, etc.).     There are telephone numbers below that you can call to establish a primary care doctor as well as to schedule a follow-up appointment with a gastroenterologist.

## 2022-03-01 NOTE — ED Triage Notes (Signed)
Pt c/o blood in his stools since last week, after having diarrhea for 2 days. Pt states the blood began to increase last night when he had a BM.

## 2022-03-01 NOTE — ED Provider Notes (Signed)
Care of patient assumed from Dr. Waverly Ferrari at 7:30 AM.  This patient presents with concern of bloody stool.  His lab work is reassuring with a normal hemoglobin.  He is currently awaiting CT scan to assess for possible colitis or diverticulitis.  Follow-up on results.  He will likely be appropriate for discharge with outpatient follow-up. Physical Exam  BP 116/77   Pulse 66   Temp 97.8 F (36.6 C) (Oral)   Resp 18   Ht '5\' 9"'$  (1.753 m)   Wt 104.3 kg   SpO2 99%   BMI 33.97 kg/m   Physical Exam Vitals and nursing note reviewed.  Constitutional:      General: He is not in acute distress.    Appearance: Normal appearance. He is well-developed.  HENT:     Head: Normocephalic and atraumatic.     Right Ear: External ear normal.     Left Ear: External ear normal.     Nose: Nose normal.     Mouth/Throat:     Mouth: Mucous membranes are moist.     Pharynx: Oropharynx is clear.  Eyes:     Conjunctiva/sclera: Conjunctivae normal.  Cardiovascular:     Rate and Rhythm: Normal rate and regular rhythm.     Heart sounds: No murmur heard. Pulmonary:     Effort: Pulmonary effort is normal. No respiratory distress.     Breath sounds: Normal breath sounds.  Abdominal:     General: There is no distension.     Palpations: Abdomen is soft.     Tenderness: There is no abdominal tenderness.  Musculoskeletal:        General: No swelling. Normal range of motion.     Cervical back: Neck supple.  Skin:    General: Skin is warm and dry.  Neurological:     General: No focal deficit present.     Mental Status: He is alert and oriented to person, place, and time.     Cranial Nerves: No cranial nerve deficit.     Sensory: No sensory deficit.     Motor: No weakness.     Coordination: Coordination normal.  Psychiatric:        Mood and Affect: Mood normal.        Thought Content: Thought content normal.        Judgment: Judgment normal.     Procedures  Procedures  ED Course / MDM    Medical  Decision Making Amount and/or Complexity of Data Reviewed Labs: ordered. Radiology: ordered.  Risk Prescription drug management.   On assessment, patient resting comfortably.  CT scan showed sigmoid diverticula.  This is a likely source of his recent bleeding.  Although there was a small area of possible early inflammation on posterior wall sigmoid diverticula, no antibiotics are indicated in the absence of any pain or systemic symptoms.  He was advised to follow-up with GI.  He was also instructed on possible complications which would include worsened bleeding and/or worsened inflammation causing additional symptoms.  In the event of these complications, patient was advised to return to the ED.  He expressed understanding.  He was discharged in good condition.       Godfrey Pick, MD 03/01/22 [phone]

## 2022-03-01 NOTE — ED Notes (Signed)
Patient transported to CT 

## 2022-03-01 NOTE — ED Provider Notes (Signed)
[facility] EMERGENCY DEPARTMENT Provider Note   CSN: [id number] Arrival date & time: 03/01/22  0455     History  Chief Complaint  Patient presents with   Rectal Bleeding    Caleb Hunt is a 61 y.o. male.  Patient presents to the emergency department for rectal bleeding.  Patient reports that he had 3 days of diarrhea a week ago.  After the diarrhea resolved, he started having normal bowel movements but noticed blood in it.  That resolved for a few days and then in the last 2 to 3 days his bleeding has returned and is becoming worse.  He reports that his abdomen feels uncomfortable but not specifically a pain.  No rectal pain.  No fever.  The other day he had some tachycardia and generalized weakness but that was after he worked outside in the heat all day.  No syncope.  No hematemesis.       Home Medications Prior to Admission medications   Medication Sig Start Date End Date Taking? Authorizing Provider  Efinaconazole 10 % SOLN Apply 1 application topically daily. Patient not taking: No sig reported 05/17/20   Lindell Spar, MD  MILK THISTLE PO Take 1 capsule by mouth daily.    [provider]  Multiple Vitamins-Minerals (MULTIVITAMIN WITH MINERALS) tablet Take 1 tablet by mouth daily.    [provider]  sildenafil (VIAGRA) 100 MG tablet Take 1 tablet (100 mg total) by mouth daily as needed for erectile dysfunction. Patient not taking: Reported on 10/09/2020 06/07/20   Lindell Spar, MD      Allergies    Patient has no known allergies.    Review of Systems   Review of Systems  Physical Exam Updated Vital Signs BP (!) 141/83 (BP Location: Right Arm)   Pulse 74   Temp 97.8 F (36.6 C) (Oral)   Resp 17   Ht '5\' 9"'$  (1.753 m)   Wt 104.3 kg   SpO2 98%   BMI 33.97 kg/m  Physical Exam Vitals and nursing note reviewed.  Constitutional:      General: He is not in acute distress.    Appearance: He is well-developed.  HENT:     Head: Normocephalic  and atraumatic.     Mouth/Throat:     Mouth: Mucous membranes are moist.  Eyes:     General: Vision grossly intact. Gaze aligned appropriately.     Extraocular Movements: Extraocular movements intact.     Conjunctiva/sclera: Conjunctivae normal.  Cardiovascular:     Rate and Rhythm: Normal rate and regular rhythm.     Pulses: Normal pulses.     Heart sounds: Normal heart sounds, S1 normal and S2 normal. No murmur heard.    No friction rub. No gallop.  Pulmonary:     Effort: Pulmonary effort is normal. No respiratory distress.     Breath sounds: Normal breath sounds.  Abdominal:     Palpations: Abdomen is soft.     Tenderness: There is no abdominal tenderness. There is no guarding or rebound.     Hernia: No hernia is present.  Musculoskeletal:        General: No swelling.     Cervical back: Full passive range of motion without pain, normal range of motion and neck supple. No pain with movement, spinous process tenderness or muscular tenderness. Normal range of motion.     Right lower leg: No edema.     Left lower leg: No edema.  Skin:  General: Skin is warm and dry.     Capillary Refill: Capillary refill takes less than 2 seconds.     Findings: No ecchymosis, erythema, lesion or wound.  Neurological:     Mental Status: He is alert and oriented to person, place, and time.     GCS: GCS eye subscore is 4. GCS verbal subscore is 5. GCS motor subscore is 6.     Cranial Nerves: Cranial nerves 2-12 are intact.     Sensory: Sensation is intact.     Motor: Motor function is intact. No weakness or abnormal muscle tone.     Coordination: Coordination is intact.  Psychiatric:        Mood and Affect: Mood normal.        Speech: Speech normal.        Behavior: Behavior normal.     ED Results / Procedures / Treatments   Labs (all labs ordered are listed, but only abnormal results are displayed) Labs Reviewed  CBC WITH DIFFERENTIAL/PLATELET  COMPREHENSIVE METABOLIC PANEL  PROTIME-INR   TYPE AND SCREEN    EKG None  Radiology No results found.  Procedures Procedures    Medications Ordered in ED Medications - No data to display  ED Course/ Medical Decision Making/ A&P                           Medical Decision Making Amount and/or Complexity of Data Reviewed Labs: ordered. Radiology: ordered.  Risk Prescription drug management.   Presents to the emergency department for evaluation of rectal bleeding.  Patient reports that he had onset of diarrhea which resolved and then he has noticed blood in his stool since.  Vital signs are stable at arrival.  Hemoglobin is stable.  Patient signed out to oncoming ER physician to follow-up on CT scan.        Final Clinical Impression(s) / ED Diagnoses Final diagnoses:  None  Rectal bleeding  Rx / DC Orders ED Discharge Orders     None         Orpah Greek, MD 03/09/22 [phone]

## 2022-03-02 ENCOUNTER — Ambulatory Visit (INDEPENDENT_AMBULATORY_CARE_PROVIDER_SITE_OTHER): Payer: No Typology Code available for payment source | Admitting: Gastroenterology

## 2022-03-02 ENCOUNTER — Encounter (INDEPENDENT_AMBULATORY_CARE_PROVIDER_SITE_OTHER): Payer: Self-pay | Admitting: Gastroenterology

## 2022-03-02 DIAGNOSIS — K625 Hemorrhage of anus and rectum: Secondary | ICD-10-CM

## 2022-03-02 DIAGNOSIS — K644 Residual hemorrhoidal skin tags: Secondary | ICD-10-CM | POA: Diagnosis not present

## 2022-03-02 DIAGNOSIS — K429 Umbilical hernia without obstruction or gangrene: Secondary | ICD-10-CM | POA: Diagnosis not present

## 2022-03-02 NOTE — Patient Instructions (Addendum)
Increase fiber intake in diet If still straining to have BM, start taking Miralax 1 capful every day for one week. If bowel movements do not improve, increase to 1 capful every 12 hours. If after two weeks there is no improvement, increase to 1 capful every 8 hours If worsening hernia size or abdominal pain, may consider surgical evaluation

## 2022-03-02 NOTE — Progress Notes (Signed)
[name], M.D. Gastroenterology & Hepatology [facility] [address] [place], [place] [address]  Primary Care Physician: Lindell Spar, MD [address] [place] [address]  I will communicate my assessment and recommendations to the referring MD via EMR.  Problems: Rectal bleeding Chronic hepatitis C genotype 1a status post Harvoni Umbilical hernia  History of Present Illness: Caleb Hunt is a 61 y.o. male with past medical history of drug abuse in remission and hepatitis C genotype 1a on Harvoni, who presents for follow up of rectal bleeding and evaluation of abdominal hernia.  The patient was last seen on 10/09/2020. At that time, the patient had viral testing performed for evaluation of hepatitis C which came back negative consistent with cure.  Patient reports that 2 weeks ago he presented new onset of rectal bleeding in scant amount, which recurred on Friday and then yesterday. He denied having any rectal pain. He reports having very hard stool prior to the onset of his symptoms and he saw the blood in his stool afterwards. He saw the stool was darker so he was concerned. He denies having any other symptoms such as nausea, vomiting, fever, chills, melena, hematemesis, abdominal distention, abdominal pain, diarrhea, jaundice, pruritus or weight loss.  The patient came to the hospital yesterday night for further evaluation of his rectal bleeding.  His CBC showed a stable hemoglobin of 14.8 with rest of cell lines within normal limits, CMP showed a BUN of 22, creatinine 0.82, electrolytes and liver function were within normal limits.  Underwent a CT of the abdomen and pelvis with IV contrast which showed presence of sigmoid diverticulosis with mild wall thickening and hyperemia of the vasa recta with questionable changes of diverticulitis.  The patient was discharged home without antibiotics as he was not having any  pain.  He reports he recently developed a hernia in his hernia after he did some strenuous efforts pulling some objects.  He is concerned about the need to undergo management of this hernia.  Last Colonoscopy: 06/11/2020 - five polyps (TC, AC, DC and rectum) were removed, max size of polyp 8 mm - two tubular adenomas, one hyperplastic polyp, rest was normal mucosa - repeat in 5 years  Past Medical History: Past Medical History:  Diagnosis Date   Hep C w/o coma, chronic ([place])     Past Surgical History: Past Surgical History:  Procedure Laterality Date   CHOLECYSTECTOMY     COLONOSCOPY WITH PROPOFOL N/A 06/11/2020   Procedure: COLONOSCOPY WITH PROPOFOL;  Surgeon: Harvel Quale, MD;  [facility];  Service: Gastroenterology;  Laterality: N/A;  945   POLYPECTOMY  06/11/2020   Procedure: POLYPECTOMY;  Surgeon: Montez Morita, Quillian Quince, MD;  Location: [facility];  Service: Gastroenterology;;    Family History: Family History  Problem Relation Age of Onset   Diabetes Mother    Diabetes Father    Heart disease Father    Hepatitis C Father    Diabetes Sister    Psoriasis Sister     Social History: Social History   Tobacco Use  Smoking Status Every Day   Packs/day: 0.25   Types: Cigarettes  Smokeless Tobacco Never  Tobacco Comments   Patient states he smokes one cigarette daily   Social History   Substance and Sexual Activity  Alcohol Use Yes   Comment: beer daily   Social History   Substance and Sexual Activity  Drug Use No    Allergies:  No Known Allergies  Medications: Current Outpatient Medications  Medication Sig Dispense Refill   MILK THISTLE PO Take 1 capsule by mouth daily.     Multiple Vitamins-Minerals (MULTIVITAMIN WITH MINERALS) tablet Take 1 tablet by mouth daily.     No current facility-administered medications for this visit.    Review of Systems: GENERAL: negative for malaise, night sweats HEENT: No changes in  hearing or vision, no nose bleeds or other nasal problems. NECK: Negative for lumps, goiter, pain and significant neck swelling RESPIRATORY: Negative for cough, wheezing CARDIOVASCULAR: Negative for chest pain, leg swelling, palpitations, orthopnea GI: SEE HPI MUSCULOSKELETAL: Negative for joint pain or swelling, back pain, and muscle pain. SKIN: Negative for lesions, rash PSYCH: Negative for sleep disturbance, mood disorder and recent psychosocial stressors. HEMATOLOGY Negative for prolonged bleeding, bruising easily, and swollen nodes. ENDOCRINE: Negative for cold or heat intolerance, polyuria, polydipsia and goiter. NEURO: negative for tremor, gait imbalance, syncope and seizures. The remainder of the review of systems is noncontributory.   Physical Exam: BP 126/79 (BP Location: Right Arm, Patient Position: Sitting, Cuff Size: Large)   Pulse 75   Temp 98.2 F (36.8 C) (Oral)   Ht '5\' 9"'$  (1.753 m)   Wt 235 lb 8 oz (106.8 kg)   BMI 34.78 kg/m  GENERAL: The patient is AO x3, in no acute distress. HEENT: Head is normocephalic and atraumatic. EOMI are intact. Mouth is well hydrated and without lesions. NECK: Supple. No masses LUNGS: Clear to auscultation. No presence of rhonchi/wheezing/rales. Adequate chest expansion HEART: RRR, normal s1 and s2. ABDOMEN: Soft, nontender, no guarding, no peritoneal signs, and nondistended. BS +. Has a small periumbilical mass which is reducible. RECTAL EXAM: has small external hemorrhoids, normal tone, no masses, brown stool without blood. Chaperone: Gustavus Bryant, CMA EXTREMITIES: Without any cyanosis, clubbing, rash, lesions or edema. NEUROLOGIC: AOx3, no focal motor deficit. SKIN: no jaundice, no rashes  Imaging/Labs: as above  I personally reviewed and interpreted the available labs, imaging and endoscopic files.  Impression and Plan: Caleb Hunt is a 61 y.o. male with past medical history of drug abuse in remission and hepatitis C  genotype 1a on Harvoni, who presents for follow up of rectal bleeding and evaluation of abdominal hernia.  The patient has presented episodes of painless rectal bleeding in small amounts.  He has not presented any significant drop in his hemoglobin and underwent recent cross-sectional abdominal imaging that showed mild hyperemia but no severe inflammatory changes.  I do consider that his symptoms are related to benign anorectal disease given the presence of hemorrhoids in today's exam but they could also be related to diverticular bleeding.  This episode may have been exacerbated by the low intake of fiber and frequent straining with moving his bowels.  He would benefit from taking MiraLAX every day and uptitrate as needed to soften his bowel movements and to increase the intake of fiber.  Finally, he has a small umbilical hernia without concerning abnormalities.  I advised him to follow with surgeon if he develops worsening abdominal pain or if the hernia increase in size.  He was also given advice regarding the need to be seen in the ER urgently if he has changes concerning for an incarcerated hernia.  - Increase fiber intake in diet - If still straining to have BM, start taking Miralax 1 capful every day for one week. If bowel movements do not improve, increase to 1 capful every 12 hours. If after two weeks there is no  improvement, increase to 1 capful every 8 hours - If worsening hernia size or abdominal pain, may consider surgical evaluation  All questions were answered.      Harvel Quale, MD Gastroenterology and Hepatology [facility]

## 2022-04-08 ENCOUNTER — Encounter: Payer: Self-pay | Admitting: Internal Medicine

## 2022-04-08 ENCOUNTER — Ambulatory Visit (INDEPENDENT_AMBULATORY_CARE_PROVIDER_SITE_OTHER): Payer: No Typology Code available for payment source | Admitting: Internal Medicine

## 2022-04-08 VITALS — BP 128/82 | HR 70 | Resp 18 | Ht 69.0 in | Wt 236.2 lb

## 2022-04-08 DIAGNOSIS — Z0001 Encounter for general adult medical examination with abnormal findings: Secondary | ICD-10-CM | POA: Diagnosis not present

## 2022-04-08 DIAGNOSIS — Z23 Encounter for immunization: Secondary | ICD-10-CM

## 2022-04-08 DIAGNOSIS — K644 Residual hemorrhoidal skin tags: Secondary | ICD-10-CM

## 2022-04-08 DIAGNOSIS — Z125 Encounter for screening for malignant neoplasm of prostate: Secondary | ICD-10-CM | POA: Insufficient documentation

## 2022-04-08 DIAGNOSIS — B182 Chronic viral hepatitis C: Secondary | ICD-10-CM | POA: Diagnosis not present

## 2022-04-08 DIAGNOSIS — R7303 Prediabetes: Secondary | ICD-10-CM

## 2022-04-08 DIAGNOSIS — E559 Vitamin D deficiency, unspecified: Secondary | ICD-10-CM

## 2022-04-08 DIAGNOSIS — Z720 Tobacco use: Secondary | ICD-10-CM

## 2022-04-08 DIAGNOSIS — E669 Obesity, unspecified: Secondary | ICD-10-CM | POA: Diagnosis not present

## 2022-04-08 NOTE — Assessment & Plan Note (Signed)
Now improved Uses MiraLAX as needed for constipation

## 2022-04-08 NOTE — Assessment & Plan Note (Signed)
Diet modification and moderate exercise discussed Patient expressed understanding.

## 2022-04-08 NOTE — Patient Instructions (Addendum)
Please take Magnesium oxide 200 mg once daily. Okay to take CoQ10 once daily for leg cramps.  Please continue to follow low carb diet and perform moderate exercise/walking at least 150 mins/week.  Please get fasting blood tests done within a week.

## 2022-04-08 NOTE — Assessment & Plan Note (Signed)
Previously diagnosed in 1999, was confirmed to have positive RNA in 2017 - treated Follow up with GI Will check CMP for now

## 2022-04-08 NOTE — Progress Notes (Signed)
Established Patient Office Visit  Subjective:  Patient ID: Caleb Hunt, male    DOB: [birth year]  Age: 61 y.o. MRN: [id number]  CC:  Chief Complaint  Patient presents with   Annual Exam    HPI Caleb Hunt is a 61 y.o. male with past medical history of chronic hep C and tobacco use who presents for annual physical.  He recently had episodes of rectal bleeding, likely due to external hemorrhoids.  He has had GI visit for it and was advised to take MiraLAX as needed for constipation.  He currently denies any rectal pain or bleeding.  Denies any melena, nausea or vomiting.  He has cut down smoking, and smokes only 1 cigarette/day now.  He received Tdap vaccine today.     Past Medical History:  Diagnosis Date   Hep C w/o coma, chronic ([place])     Past Surgical History:  Procedure Laterality Date   CHOLECYSTECTOMY     COLONOSCOPY WITH PROPOFOL N/A 06/11/2020   Procedure: COLONOSCOPY WITH PROPOFOL;  Surgeon: Harvel Quale, MD;  [facility];  Service: Gastroenterology;  Laterality: N/A;  945   POLYPECTOMY  06/11/2020   Procedure: POLYPECTOMY;  Surgeon: Montez Morita, Quillian Quince, MD;  [facility];  Service: Gastroenterology;;    Family History  Problem Relation Age of Onset   Diabetes Mother    Diabetes Father    Heart disease Father    Hepatitis C Father    Diabetes Sister    Psoriasis Sister     Social History   Socioeconomic History   Marital status: Married    Spouse name: Not on file   Number of children: Not on file   Years of education: Not on file   Highest education level: Not on file  Occupational History   Not on file  Tobacco Use   Smoking status: Every Day    Packs/day: 0.25    Types: Cigarettes   Smokeless tobacco: Never   Tobacco comments:    Patient states he smokes one cigarette daily  Vaping Use   Vaping Use: Never used  Substance and Sexual Activity   Alcohol use: Yes    Comment: beer daily   Drug  use: No   Sexual activity: Not on file  Other Topics Concern   Not on file  Social History Narrative   Not on file   Social Determinants of Health   Financial Resource Strain: Not on file  Food Insecurity: Not on file  Transportation Needs: Not on file  Physical Activity: Not on file  Stress: Not on file  Social Connections: Not on file  Intimate Partner Violence: Not on file    Outpatient Medications Prior to Visit  Medication Sig Dispense Refill   MILK THISTLE PO Take 1 capsule by mouth daily.     Multiple Vitamins-Minerals (MULTIVITAMIN WITH MINERALS) tablet Take 1 tablet by mouth daily.     No facility-administered medications prior to visit.    No Known Allergies  ROS Review of Systems  Constitutional:  Negative for chills and fever.  HENT:  Negative for congestion and sore throat.   Eyes:  Negative for pain and discharge.  Respiratory:  Negative for cough and shortness of breath.   Cardiovascular:  Negative for chest pain and palpitations.  Gastrointestinal:  Negative for constipation, diarrhea, nausea and vomiting.  Endocrine: Negative for polydipsia and polyuria.  Genitourinary:  Negative for dysuria and hematuria.  Musculoskeletal:  Negative for neck pain and neck  stiffness.  Skin:  Negative for rash.  Neurological:  Negative for dizziness, weakness, numbness and headaches.  Psychiatric/Behavioral:  Negative for agitation and behavioral problems.       Objective:    Physical Exam Vitals reviewed.  Constitutional:      General: He is not in acute distress.    Appearance: He is not diaphoretic.  HENT:     Head: Normocephalic and atraumatic.     Nose: Nose normal.     Mouth/Throat:     Mouth: Mucous membranes are moist.  Eyes:     General: No scleral icterus.    Extraocular Movements: Extraocular movements intact.  Cardiovascular:     Rate and Rhythm: Normal rate and regular rhythm.     Pulses: Normal pulses.     Heart sounds: Normal heart sounds.  No murmur heard. Pulmonary:     Breath sounds: Normal breath sounds. No wheezing or rales.  Abdominal:     Palpations: Abdomen is soft.     Tenderness: There is no abdominal tenderness.  Musculoskeletal:     Cervical back: Neck supple. No tenderness.     Right lower leg: No edema.     Left lower leg: No edema.  Skin:    General: Skin is warm.  Neurological:     General: No focal deficit present.     Mental Status: He is alert and oriented to person, place, and time.     Cranial Nerves: No cranial nerve deficit.     Sensory: No sensory deficit.     Motor: No weakness.  Psychiatric:        Mood and Affect: Mood normal.        Behavior: Behavior normal.     BP 128/82 (BP Location: Right Arm, Patient Position: Sitting, Cuff Size: Normal)   Pulse 70   Resp 18   Ht $R'5\' 9"'Hk$  (1.753 m)   Wt 236 lb 3.2 oz (107.1 kg)   SpO2 98%   BMI 34.88 kg/m  Wt Readings from Last 3 Encounters:  04/08/22 236 lb 3.2 oz (107.1 kg)  03/02/22 235 lb 8 oz (106.8 kg)  03/01/22 230 lb (104.3 kg)    Lab Results  Component Value Date   TSH 4.150 06/10/2020   Lab Results  Component Value Date   WBC 7.5 03/01/2022   HGB 14.8 03/01/2022   HCT 43.3 03/01/2022   MCV 90.4 03/01/2022   PLT 239 03/01/2022   Lab Results  Component Value Date   NA 135 03/01/2022   K 4.2 03/01/2022   CO2 24 03/01/2022   GLUCOSE 120 (H) 03/01/2022   BUN 22 (H) 03/01/2022   CREATININE 0.82 03/01/2022   BILITOT 0.7 03/01/2022   ALKPHOS 80 03/01/2022   AST 28 03/01/2022   ALT 22 03/01/2022   PROT 7.2 03/01/2022   ALBUMIN 4.1 03/01/2022   CALCIUM 8.8 (L) 03/01/2022   ANIONGAP 7 03/01/2022   Lab Results  Component Value Date   CHOL 158 06/10/2020   Lab Results  Component Value Date   HDL 41 06/10/2020   Lab Results  Component Value Date   LDLCALC 91 06/10/2020   Lab Results  Component Value Date   TRIG 151 (H) 06/10/2020   Lab Results  Component Value Date   CHOLHDL 3.9 06/10/2020   Lab Results   Component Value Date   HGBA1C 5.8 (H) 06/10/2020      Assessment & Plan:   Problem List Items Addressed This Visit  Cardiovascular and Mediastinum   External hemorrhoids    Now improved Uses MiraLAX as needed for constipation        Digestive   Chronic hepatitis C ([place])    Previously diagnosed in 1999, was confirmed to have positive RNA in 2017 - treated Follow up with GI Will check CMP for now      Relevant Orders   CMP14+EGFR     Other   Obesity (BMI 30-39.9)    Diet modification and moderate exercise discussed Patient expressed understanding.      Relevant Orders   TSH   Lipid Profile   Tobacco abuse    Smokes 1 cigarette/day now  Asked about quitting: confirms they are currently smokeing cigarettes Advise to quit smoking: Educated about QUITTING to reduce the risk of cancer, cardio and cerebrovascular disease. Assess willingness: Willing to quit Assist with counseling and pharmacotherapy: Counseled for 5 minutes and literature provided. Arrange for follow up: Will follow up and continue to offer help.      Encounter for general adult medical examination with abnormal findings - Primary    Physical exam as documented. Counseling done  re healthy lifestyle involving commitment to 150 minutes exercise per week, heart healthy diet, and attaining healthy weight.The importance of adequate sleep also discussed. Changes in health habits are decided on by the patient with goals and time frames  set for achieving them. Immunization and cancer screening needs are specifically addressed at this visit.      Relevant Orders   TSH   CMP14+EGFR   Lipid Profile   Prostate cancer screening    Ordered PSA after discussing its limitations for prostate cancer screening, including false positive results leading additional investigations.      Relevant Orders   PSA   Other Visit Diagnoses     Prediabetes       Relevant Orders   Hemoglobin A1c   CMP14+EGFR    Vitamin D deficiency       Relevant Orders   VITAMIN D 25 Hydroxy (Vit-D Deficiency, Fractures)   Need for Tdap vaccination       Relevant Orders   Tdap vaccine greater than or equal to 7yo IM (Completed)       No orders of the defined types were placed in this encounter.   Follow-up: Return in about 1 year (around 04/09/2023) for Annual physical.    Lindell Spar, MD

## 2022-04-08 NOTE — Assessment & Plan Note (Signed)
Smokes 1 cigarette/day now  Asked about quitting: confirms they are currently smokeing cigarettes Advise to quit smoking: Educated about QUITTING to reduce the risk of cancer, cardio and cerebrovascular disease. Assess willingness: Willing to quit Assist with counseling and pharmacotherapy: Counseled for 5 minutes and literature provided. Arrange for follow up: Will follow up and continue to offer help.

## 2022-04-08 NOTE — Assessment & Plan Note (Signed)

## 2022-04-08 NOTE — Assessment & Plan Note (Signed)
Ordered PSA after discussing its limitations for prostate cancer screening, including false positive results leading additional investigations. 

## 2022-07-12 ENCOUNTER — Other Ambulatory Visit: Payer: Self-pay

## 2022-07-12 ENCOUNTER — Emergency Department (HOSPITAL_COMMUNITY)
Admission: EM | Admit: 2022-07-12 | Discharge: 2022-07-12 | Disposition: A | Discharge status: Home / Self Care | Payer: No Typology Code available for payment source | Attending: Emergency Medicine | Admitting: Emergency Medicine

## 2022-07-12 ENCOUNTER — Emergency Department (HOSPITAL_COMMUNITY): Payer: No Typology Code available for payment source

## 2022-07-12 DIAGNOSIS — S6992XA Unspecified injury of left wrist, hand and finger(s), initial encounter: Secondary | ICD-10-CM | POA: Diagnosis present

## 2022-07-12 DIAGNOSIS — W268XXA Contact with other sharp object(s), not elsewhere classified, initial encounter: Secondary | ICD-10-CM | POA: Insufficient documentation

## 2022-07-12 DIAGNOSIS — S61012A Laceration without foreign body of left thumb without damage to nail, initial encounter: Secondary | ICD-10-CM | POA: Insufficient documentation

## 2022-07-12 MED ORDER — CEPHALEXIN 500 MG PO CAPS: 500.0000 mg | ORAL_CAPSULE | Freq: Four times a day (QID) | ORAL | 0 refills | Status: DC

## 2022-07-12 MED ORDER — BUPIVACAINE HCL (PF) 0.5 % IJ SOLN
10.0000 mL | Freq: Once | INTRAMUSCULAR | Status: AC
  Administered 2022-07-12: 10 mL
  Filled 2022-07-12: qty 30

## 2022-07-12 NOTE — ED Triage Notes (Signed)
Pt c/o laceration to left thumb from box cutters. Pt is up to date with vaccines.

## 2022-07-12 NOTE — Discharge Instructions (Addendum)
You are seen to the emergency department due to left thumb injury.  X-ray was negative, the wound was repaired with sutures.  You will need to have them removed in about a week, call your primary to schedule wound recheck and have them removed.  If you develop signs of infection such as inability to move the thumb, fevers, spreading redness or significant swelling or pus coming out of the wound you should return to the ED for evaluation.  Take the antibiotics 4 times daily for 5 days to prevent infection.  Wash soap and water, do not fully Marjan water.

## 2022-07-12 NOTE — ED Provider Notes (Signed)
[facility] EMERGENCY DEPARTMENT Provider Note   CSN: [id number] Arrival date & time: 07/12/22  1026     History  Chief Complaint  Patient presents with   Laceration    Caleb Hunt is a 61 y.o. male.   Laceration    Patient presents due to laceration over knuckle of left thumb.  Happened while using a box cutter accidentally.  Denies any paresthesias, is able to flex extend abduction out of the thumb without any difficulty.  Not on blood thinners, last in this was a year ago.  Right-hand-dominant  Home Medications Prior to Admission medications   Medication Sig Start Date End Date Taking? Authorizing Provider  MILK THISTLE PO Take 1 capsule by mouth daily.    [provider]  Multiple Vitamins-Minerals (MULTIVITAMIN WITH MINERALS) tablet Take 1 tablet by mouth daily.    [provider]      Allergies    Patient has no known allergies.    Review of Systems   Review of Systems  Physical Exam Updated Vital Signs BP 133/88 (BP Location: Right Arm)   Pulse 78   Temp 98.1 F (36.7 C) (Oral)   Resp 15   Ht '5\' 9"'$  (1.753 m)   Wt 102.1 kg   SpO2 97%   BMI 33.23 kg/m  Physical Exam Vitals and nursing note reviewed. Exam conducted with a chaperone present.  Constitutional:      General: He is not in acute distress.    Appearance: Normal appearance.  HENT:     Head: Normocephalic and atraumatic.  Eyes:     General: No scleral icterus.    Extraocular Movements: Extraocular movements intact.     Pupils: Pupils are equal, round, and reactive to light.  Cardiovascular:     Pulses: Normal pulses.  Musculoskeletal:        General: Tenderness present. Normal range of motion.     Comments: Fully able to abduct, AB duct flex and extend at the left thumb.    Skin:    Capillary Refill: Capillary refill takes less than 2 seconds.     Coloration: Skin is not jaundiced.     Comments: 4 cm laceration dorsum left thumb at MCP, does not extend to the nailbed.   Not circumferential.  No tendon or nerves visualized.  Neurological:     Mental Status: He is alert. Mental status is at baseline.     Coordination: Coordination normal.     ED Results / Procedures / Treatments   Labs (all labs ordered are listed, but only abnormal results are displayed) Labs Reviewed - No data to display  EKG None  Radiology DG Finger Thumb Left  Result Date: 07/12/2022 CLINICAL DATA:  Laceration from box cutter EXAM: LEFT THUMB 2+V COMPARISON:  None Available. FINDINGS: Wound dressings are identified over the region of the laceration at the level of the first MCP joint. No fracture. No foreign body identified. IMPRESSION: Wound dressings. No fracture or foreign body identified. Electronically Signed   By: Dorise Bullion III M.D.   On: 07/12/2022 11:04    Procedures .Marland KitchenLaceration Repair  Date/Time: 07/12/2022 12:48 PM  Performed by: Sherrill Raring, PA-C Authorized by: Sherrill Raring, PA-C   Consent:    Consent obtained:  Verbal   Consent given by:  Patient   Risks, benefits, and alternatives were discussed: yes     Risks discussed:  Infection, pain, retained foreign body, need for additional repair, poor cosmetic result, nerve damage, poor wound healing and  vascular damage   Alternatives discussed:  No treatment Universal protocol:    Procedure explained and questions answered to patient or proxy's satisfaction: yes     Relevant documents present and verified: yes     Test results available: yes     Imaging studies available: yes     Required blood products, implants, devices, and special equipment available: yes     Site/side marked: yes     Immediately prior to procedure, a time out was called: yes     Patient identity confirmed:  Verbally with patient and arm band Anesthesia:    Anesthesia method:  Local infiltration   Local anesthetic:  Bupivacaine 0.5% w/o epi Laceration details:    Location:  Finger   Finger location:  L thumb   Length (cm):  4    Depth (mm):  3 Pre-procedure details:    Preparation:  Patient was prepped and draped in usual sterile fashion and imaging obtained to evaluate for foreign bodies Exploration:    Hemostasis achieved with:  Direct pressure   Imaging obtained: x-ray     Imaging outcome: foreign body not noted     Wound exploration: wound explored through full range of motion and entire depth of wound visualized     Contaminated: no   Treatment:    Area cleansed with:  Povidone-iodine   Amount of cleaning:  Standard   Irrigation solution:  Sterile saline   Irrigation volume:  500   Irrigation method:  Pressure wash   Visualized foreign bodies/material removed: no   Skin repair:    Repair method:  Sutures   Suture size:  4-0   Suture material:  Nylon   Suture technique:  Simple interrupted   Number of sutures:  5 Approximation:    Approximation:  Close Repair type:    Repair type:  Simple Post-procedure details:    Dressing:  Antibiotic ointment and non-adherent dressing   Procedure completion:  Tolerated well, no immediate complications     Medications Ordered in ED Medications  bupivacaine(PF) (MARCAINE) 0.5 % injection 10 mL (has no administration in time range)    ED Course/ Medical Decision Making/ A&P                           Medical Decision Making Amount and/or Complexity of Data Reviewed Radiology: ordered.  Risk Prescription drug management.   Patient presents due to laceration to thumb.  On exam he is neurovascular intact with brisk cap refill, radial pulses 2+.  He is able to flex and extend and AB duct and adduct at the thumb without any difficulty.  No tendons visualized, sensation to light touch is circumferentially intact.  X-ray is negative for any bony involvement.  Patient's tetanus is up-to-date, wound was irrigated thoroughly and repaired without complication.  Complete ROM after repair.  Wound care, follow-up and wrist were discussed with the patient.           Final Clinical Impression(s) / ED Diagnoses Final diagnoses:  None    Rx / DC Orders ED Discharge Orders     None         Sherrill Raring, PA-C 07/12/22 1251    Fredia Sorrow, MD 07/15/22 [phone]

## 2022-08-26 ENCOUNTER — Emergency Department (HOSPITAL_COMMUNITY): Payer: No Typology Code available for payment source

## 2022-08-26 ENCOUNTER — Emergency Department (HOSPITAL_COMMUNITY)
Admission: EM | Admit: 2022-08-26 | Discharge: 2022-08-26 | Disposition: A | Discharge status: Home / Self Care | Payer: No Typology Code available for payment source | Attending: Emergency Medicine | Admitting: Emergency Medicine

## 2022-08-26 DIAGNOSIS — X501XXA Overexertion from prolonged static or awkward postures, initial encounter: Secondary | ICD-10-CM | POA: Diagnosis not present

## 2022-08-26 DIAGNOSIS — M25561 Pain in right knee: Secondary | ICD-10-CM | POA: Diagnosis not present

## 2022-08-26 MED ORDER — KETOROLAC TROMETHAMINE 15 MG/ML IJ SOLN
15.0000 mg | Freq: Once | INTRAMUSCULAR | Status: AC
  Administered 2022-08-26: 15 mg via INTRAMUSCULAR
  Filled 2022-08-26: qty 1

## 2022-08-26 MED ORDER — NAPROXEN 500 MG PO TABS: 500.0000 mg | ORAL_TABLET | Freq: Two times a day (BID) | ORAL | 0 refills | Status: DC

## 2022-08-26 NOTE — ED Provider Notes (Signed)
[facility] EMERGENCY DEPARTMENT Provider Note   CSN: [id number] Arrival date & time: 08/26/22  1516     History  Chief Complaint  Patient presents with   Leg Pain    Left calf    Caleb Hunt is a 62 y.o. male.  He has history of chronic hepatitis C.  ER complaining of left posterior knee pain that started today.  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  He reports earlier in the day he was working on his knees but wearing kneepads of some mild pain in the anterior knee.  He states he is and was going to lift a generator and as he lifted a generator he felt sudden sharp pain in the left posterior knee and has been having pain since then.  Denies any wounds to the area.  Denies any pain or swelling in the calf, no fevers or chills.  He is able to bear weight but states any movement is painful.  He has taken no medications.  Denies injuries or complaints.   Leg Pain      Home Medications Prior to Admission medications   Medication Sig Start Date End Date Taking? Authorizing Provider  naproxen (NAPROSYN) 500 MG tablet Take 1 tablet (500 mg total) by mouth 2 (two) times daily. 08/26/22  Yes Hardeep Reetz A, PA-C  cephALEXin (KEFLEX) 500 MG capsule Take 1 capsule (500 mg total) by mouth 4 (four) times daily. 07/12/22   Sherrill Raring, PA-C  MILK THISTLE PO Take 1 capsule by mouth daily.    [provider]  Multiple Vitamins-Minerals (MULTIVITAMIN WITH MINERALS) tablet Take 1 tablet by mouth daily.    [provider]      Allergies    Patient has no known allergies.    Review of Systems   Review of Systems  Physical Exam Updated Vital Signs BP (!) 158/84   Pulse 96   Temp 98.3 F (36.8 C) (Oral)   Resp 18   Ht '5\' 9"'$  (1.753 m)   Wt 104.3 kg   SpO2 96%   BMI 33.97 kg/m  Physical Exam Vitals and nursing note reviewed.  Constitutional:      General: He is not in acute distress.    Appearance: He is well-developed.  HENT:     Head: Normocephalic and  atraumatic.  Eyes:     Conjunctiva/sclera: Conjunctivae normal.  Cardiovascular:     Rate and Rhythm: Normal rate and regular rhythm.     Heart sounds: No murmur heard. Pulmonary:     Effort: Pulmonary effort is normal. No respiratory distress.     Breath sounds: Normal breath sounds.  Musculoskeletal:        General: No swelling.     Cervical back: Neck supple.     Comments: Calves are symmetric bilateral.  There is no calf tenderness.  Patient can bend left knee to 90 degrees but has pain when bending past about 30 degrees.  There is no swelling of the knee, no obvious effusion.  DP and PT pulses in the left foot are intact.  There is no overlying erythema or warmth.  Skin:    General: Skin is warm and dry.     Capillary Refill: Capillary refill takes less than 2 seconds.  Neurological:     Mental Status: He is alert.  Psychiatric:        Mood and Affect: Mood normal.     ED Results / Procedures / Treatments   Labs (all labs ordered  are listed, but only abnormal results are displayed) Labs Reviewed - No data to display  EKG None  Radiology DG Knee Complete 4 Views Left  Result Date: 08/26/2022 CLINICAL DATA:  Acute left knee pain after injury. EXAM: LEFT KNEE - COMPLETE 4+ VIEW COMPARISON:  None Available. FINDINGS: No evidence of fracture, dislocation, or joint effusion. No evidence of arthropathy or other focal bone abnormality. Soft tissues are unremarkable. IMPRESSION: Negative. Electronically Signed   By: Marijo Conception M.D.   On: 08/26/2022 17:34    Procedures Procedures    Medications Ordered in ED Medications  ketorolac (TORADOL) 15 MG/ML injection 15 mg (15 mg Intramuscular Given 08/26/22 1733)    ED Course/ Medical Decision Making/ A&P                           Medical Decision Making This patient presents to the ED for concern of left knee pain, this involves an extensive number of treatment options, and is a complaint that carries with it a high risk of  complications and morbidity.  The differential diagnosis includes fracture, sprain, strain, contusion, Baker's cyst, arthritis, DVT, other.    Additional history obtained:  Additional history obtained from EMR External records from outside source obtained and reviewed including visit for hepatitis C    Imaging Studies ordered:  I ordered imaging studies including x-ray I independently visualized and interpreted imaging which showed no fracture or dislocation I agree with the radiologist interpretation   Problem List / ED Course / Critical interventions / Medication management  Right knee pain is likely muscle strain as it happened when he was doing heavy lifting.  No calf pain or swelling to suggest VTE and pain was prompted by lifting a heavy object.  X-ray was ordered and shows no fracture or dislocation.  He is feeling better after the Toradol, will continue NSAIDs, advised on Ortho follow-up and return precautions.  He was given the immobilizer to use for comfort. I ordered medication including Toradol for pain Reevaluation of the patient after these medicines showed that the patient improved I have reviewed the patients home medicines and have made adjustments as needed    Test / Admission - Considered:  Consider ultrasound for DVT the patient has no calf pain or swelling and pain was prompted by lifting a heavy object.  I do not feel DVT study is needed at this time I have very low suspicion for DVT.  She has no VTE risk factors    Amount and/or Complexity of Data Reviewed Radiology: ordered.  Risk Prescription drug management.           Final Clinical Impression(s) / ED Diagnoses Final diagnoses:  Acute pain of right knee    Rx / DC Orders ED Discharge Orders          Ordered    naproxen (NAPROSYN) 500 MG tablet  2 times daily        08/26/22 [address] 08/26/22 2355    Sherwood Gambler, MD 08/30/22 1606

## 2022-08-26 NOTE — Discharge Instructions (Signed)
You were seen today for knee pain that started when lifting a generator today.  Your x-rays are normal.  You may have a pulled muscle or injury to the ligament or tendon.  We are going to give you a knee immobilizer and anti-inflammatories to help with the pain.  You can use crutches to help with walking as well as needed.  Follow-up with orthopedics, come back to the ER if you have any new or worsening spine symptoms especially fevers, redness, increased pain, swelling or other worrisome changes.

## 2022-08-26 NOTE — ED Triage Notes (Signed)
Pt to ED c/o left calf pain that started aprox 3 hours ago, reports was lifting a generator when occurred. No redness, or warmness noted. Reports extreme pain when tries to put weight on it.

## 2022-08-27 ENCOUNTER — Telehealth: Payer: Self-pay | Admitting: Orthopedic Surgery

## 2022-08-27 NOTE — Telephone Encounter (Signed)
Spoke w/the patient, he went to the ED yesterday at [facility].  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  Advised the patient that his employer/workers compensation will need to coordinate his appointments and provide billing information.  He will have someone call us.

## 2022-09-01 ENCOUNTER — Encounter: Payer: Self-pay | Admitting: Orthopedic Surgery

## 2022-09-01 ENCOUNTER — Ambulatory Visit (INDEPENDENT_AMBULATORY_CARE_PROVIDER_SITE_OTHER): Payer: Worker's Compensation | Admitting: Orthopedic Surgery

## 2022-09-01 VITALS — BP 143/79 | HR 89 | Ht 69.0 in | Wt 234.0 lb

## 2022-09-01 DIAGNOSIS — M25562 Pain in left knee: Secondary | ICD-10-CM | POA: Diagnosis not present

## 2022-09-01 NOTE — Patient Instructions (Signed)
Work for note - light duty only for the next 2 weeks.  Follow up in clinic in 2 weeks.

## 2022-09-02 NOTE — Progress Notes (Signed)
New Patient Visit  Assessment: Caleb Hunt is a 62 y.o. male with the following: 1. Acute pain of left knee  Plan: Caleb Hunt has had pain in the posterior aspect of the left knee for the past week.  He states the pain is improving.  Radiographs are negative.  Physical exam is negative.  He does have some tenderness in the posterior knee, but no obvious cyst formation.  Advised him to continue using the brace.  He should remain with light duty activities at work until he is evaluated again in approximately 2 weeks.  Medications as needed.  Follow-up: Return in 2 weeks (on 09/15/2022).  Subjective:  Chief Complaint  Patient presents with   Knee Pain    Left / injury at work pain behind left knee 08/26/22 date of injury pain was bad yesterday had to step off and on the equipment all day today was out of work and feels better     History of Present Illness: Caleb Hunt is a 61 y.o. male who presents for evaluation of left knee pain.  Has had pain in the left knee for about a week.  He injured this at work.  Pain is in the posterior and lateral aspect of the left knee.  He was trying to lift a heavy item, and he felt a pop.  No swelling within the knee.  Since the onset of pain, he has been unable to complete full duties at work, but notes that the pain is improving.  No prior injuries to his left knee.   Review of Systems: No fevers or chills No numbness or tingling No chest pain No shortness of breath No bowel or bladder dysfunction No GI distress No headaches   Medical History:  Past Medical History:  Diagnosis Date   Hep C w/o coma, chronic ([facility])     Past Surgical History:  Procedure Laterality Date   CHOLECYSTECTOMY     COLONOSCOPY WITH PROPOFOL N/A 06/11/2020   Procedure: COLONOSCOPY WITH PROPOFOL;  Surgeon: Harvel Quale, MD;  Location: [facility]ITE;  Service: Gastroenterology;  Laterality: N/A;  945   POLYPECTOMY  06/11/2020   Procedure:  POLYPECTOMY;  Surgeon: Montez Morita, Quillian Quince, MD;  Location: [facility] SU[facility];  Service: Gastroenterology;;    Family History  Problem Relation Age of Onset   Diabetes Mother    Diabetes Father    Heart disease Father    Hepatitis C Father    Diabetes Sister    Psoriasis Sister    Social History   Tobacco Use   Smoking status: Every Day    Packs/day: 0.25    Types: Cigarettes   Smokeless tobacco: Never   Tobacco comments:    Patient states he smokes one cigarette daily  Vaping Use   Vaping Use: Never used  Substance Use Topics   Alcohol use: Yes    Comment: beer daily   Drug use: No    No Known Allergies  Current Meds  Medication Sig   MILK THISTLE PO Take 1 capsule by mouth daily.   Multiple Vitamins-Minerals (MULTIVITAMIN WITH MINERALS) tablet Take 1 tablet by mouth daily.   naproxen (NAPROSYN) 500 MG tablet Take 1 tablet (500 mg total) by mouth 2 (two) times daily.    Objective: BP (!) 143/79   Pulse 89   Ht '5\' 9"'$  (1.753 m)   Wt 234 lb (106.1 kg)   BMI 34.56 kg/m   Physical Exam:  General: Alert and oriented. and No acute  distress. Gait: Left sided antalgic gait.  Left knee without effusion.  No bruising is appreciated.  He has good range of motion.  Mild tenderness to palpation in the posterior lateral aspect of the left knee.  No cyst or growth is appreciated within the posterior left knee.  Negative Lachman.  No increased laxity varus or valgus stress.  No tenderness palpation on the medial or lateral joint lines.  IMAGING: I personally reviewed images previously obtained from the ED  X-rays were previously obtained.  No acute injuries.  No chronic changes.   New Medications:  No orders of the defined types were placed in this encounter.     Mordecai Rasmussen, MD  09/02/2022 8:41 AM

## 2022-09-15 ENCOUNTER — Ambulatory Visit (INDEPENDENT_AMBULATORY_CARE_PROVIDER_SITE_OTHER): Payer: Worker's Compensation | Admitting: Orthopedic Surgery

## 2022-09-15 ENCOUNTER — Encounter: Payer: Self-pay | Admitting: Orthopedic Surgery

## 2022-09-15 VITALS — Ht 69.0 in | Wt 234.0 lb

## 2022-09-15 DIAGNOSIS — M25562 Pain in left knee: Secondary | ICD-10-CM | POA: Diagnosis not present

## 2022-09-15 MED ORDER — NAPROXEN 500 MG PO TABS: 500.0000 mg | ORAL_TABLET | Freq: Two times a day (BID) | ORAL | 0 refills | Status: DC

## 2022-09-15 NOTE — Patient Instructions (Signed)
Light duty at work  Physical therapy - if ready to return to work sooner, please contact the clinic for an earlier appointment.

## 2022-09-15 NOTE — Progress Notes (Signed)
Return patient Visit  Assessment: Caleb Hunt is a 62 y.o. male with the following: 1. Acute pain of left knee  Plan: Caleb Hunt his now having more pain in the medial and anterior knee.  He notes restricted flexion.  Tenderness medial.  Continue light duty.  We will coordinate referral for PT through [facility].  Follow-up in approximately 2 months.  If he is able to return to full duty earlier, he should contact the clinic.  Follow-up: Return in about 2 months (around 11/14/2022).  Subjective:  Chief Complaint  Patient presents with   Knee Pain    Lt knee stiffness and soreness since last visit but states it hurts him, so he has gotten a different brace.     History of Present Illness: Caleb Hunt is a 62 y.o. male who returns for evaluation of left knee pain.  He is no longer having pain in the posterior aspect of the left knee.  However, he is now complaining of pain in the anterior medial aspect of his knee.  He was fitted for a brace the last time he saw him, but he states this made his pain worse.  He notes stiffness in the left knee.  He continues to take naproxen.  He is interested in physical therapy.  Review of Systems: No fevers or chills No numbness or tingling No chest pain No shortness of breath No bowel or bladder dysfunction No GI distress No headaches   Objective: Ht '5\' 9"'$  (1.753 m)   Wt 234 lb (106.1 kg)   BMI 34.56 kg/m   Physical Exam:  General: Alert and oriented. and No acute distress. Gait: Left sided antalgic gait.  Left knee without effusion.  No bruising is appreciated.  He is able to achieve full extension.  Pain in the left knee with extension beyond 100 degrees.  He is tenderness to palpation of the medial tibial plateau.  Mild tenderness palpation along the medial joint line.  No increased laxity varus or valgus stress.  Negative Lachman.  No tenderness to palpation of the posterior knee.  IMAGING: No new imaging obtained today   New  Medications:  No orders of the defined types were placed in this encounter.     Mordecai Rasmussen, MD  09/15/2022 8:47 AM

## 2022-10-02 ENCOUNTER — Telehealth: Payer: Self-pay | Admitting: Internal Medicine

## 2022-10-02 ENCOUNTER — Telehealth: Payer: Self-pay | Admitting: Orthopedic Surgery

## 2022-10-02 NOTE — Telephone Encounter (Signed)
[facility] ordered PT. They will reach out to them to get order.

## 2022-10-02 NOTE — Telephone Encounter (Signed)
Tola called from [facility] needs copy of physical therapy order  Fax # [phone]  Call back # [phone] reference# [id number]

## 2022-10-02 NOTE — Telephone Encounter (Signed)
Rhonda/Vonda w/[facility] lvm regarding this patient.  She is needing a copy of the PT script faxed to them so they can schedule the patient for PT, fax#  [phone], ref # [id number].  Any questions, call [phone].

## 2022-10-05 NOTE — Telephone Encounter (Signed)
Faxed PT Order/Referral.

## 2022-10-06 ENCOUNTER — Telehealth: Payer: Self-pay | Admitting: Orthopedic Surgery

## 2022-10-22 ENCOUNTER — Encounter: Payer: Self-pay | Admitting: Radiology

## 2022-11-13 ENCOUNTER — Encounter: Payer: Self-pay | Admitting: Orthopedic Surgery

## 2022-11-13 ENCOUNTER — Ambulatory Visit (INDEPENDENT_AMBULATORY_CARE_PROVIDER_SITE_OTHER): Payer: Worker's Compensation | Admitting: Orthopedic Surgery

## 2022-11-13 VITALS — BP 132/76 | HR 92 | Ht 69.0 in | Wt 231.0 lb

## 2022-11-13 DIAGNOSIS — M25562 Pain in left knee: Secondary | ICD-10-CM

## 2022-11-13 NOTE — Progress Notes (Signed)
Return patient Visit  Assessment: Caleb Hunt is a 62 y.o. male with the following: 1. Acute pain of left knee  Plan: [name] reports that his left knee pain is much better.  He has been working with physical therapy, and this is making a big difference.  He has 2 more sessions, would like to continue with some work restrictions until he is completed with physical therapy.  If the pain returns, I have urged him to continue working on the exercises provided to him by physical therapy.  He will return to clinic as needed.  Follow-up: Return if symptoms worsen or fail to improve.  Subjective:  Chief Complaint  Patient presents with   Knee Pain    Lt knee pain has 2 more sessions of therapy.     History of Present Illness: Caleb Hunt is a 62 y.o. male who returns for evaluation of left knee pain.  He feels much better.  He has almost completed physical therapy, states this has made a big difference.  He has 2 more sessions.  He has been working.  He does not feel as though he is ready to return to full active duties at work and would like to finish physical therapy.   Review of Systems: No fevers or chills No numbness or tingling No chest pain No shortness of breath No bowel or bladder dysfunction No GI distress No headaches   Objective: BP 132/76   Pulse 92   Ht 5\' 9"  (1.753 m)   Wt 231 lb (104.8 kg)   BMI 34.11 kg/m   Physical Exam:  General: Alert and oriented. and No acute distress. Gait: Left sided antalgic gait.  Left knee without swelling.  No bruising.  No tenderness to palpation.  He has good range of motion.  Negative Lachman.  No increased laxity to varus or valgus stress.  IMAGING: No new imaging obtained today   New Medications:  No orders of the defined types were placed in this encounter.     Mordecai Rasmussen, MD  11/13/2022 10:31 PM

## 2023-04-12 ENCOUNTER — Encounter: Payer: No Typology Code available for payment source | Admitting: Internal Medicine

## 2023-04-13 ENCOUNTER — Encounter: Payer: Self-pay | Admitting: Internal Medicine

## 2023-05-04 ENCOUNTER — Ambulatory Visit (INDEPENDENT_AMBULATORY_CARE_PROVIDER_SITE_OTHER): Payer: No Typology Code available for payment source | Admitting: Internal Medicine

## 2023-05-04 ENCOUNTER — Encounter: Payer: Self-pay | Admitting: Internal Medicine

## 2023-05-04 VITALS — BP 108/70 | HR 86 | Ht 69.0 in | Wt 226.6 lb

## 2023-05-04 DIAGNOSIS — Z125 Encounter for screening for malignant neoplasm of prostate: Secondary | ICD-10-CM

## 2023-05-04 DIAGNOSIS — Z23 Encounter for immunization: Secondary | ICD-10-CM

## 2023-05-04 DIAGNOSIS — E669 Obesity, unspecified: Secondary | ICD-10-CM | POA: Diagnosis not present

## 2023-05-04 DIAGNOSIS — Z0001 Encounter for general adult medical examination with abnormal findings: Secondary | ICD-10-CM | POA: Diagnosis not present

## 2023-05-04 DIAGNOSIS — F4329 Adjustment disorder with other symptoms: Secondary | ICD-10-CM

## 2023-05-04 DIAGNOSIS — E782 Mixed hyperlipidemia: Secondary | ICD-10-CM

## 2023-05-04 DIAGNOSIS — B182 Chronic viral hepatitis C: Secondary | ICD-10-CM

## 2023-05-04 DIAGNOSIS — R7303 Prediabetes: Secondary | ICD-10-CM

## 2023-05-04 DIAGNOSIS — Z720 Tobacco use: Secondary | ICD-10-CM

## 2023-05-04 DIAGNOSIS — E559 Vitamin D deficiency, unspecified: Secondary | ICD-10-CM

## 2023-05-04 NOTE — Progress Notes (Signed)
Established Patient Office Visit  Subjective:  Patient ID: Caleb Hunt, male    DOB: 01-25-1961  Age: 62 y.o. MRN: [id number]  CC:  Chief Complaint  Patient presents with   Annual Exam    HPI Caleb Hunt is a 62 y.o. male with past medical history of chronic hep C and tobacco use who presents for annual physical.  He recently had episodes of rectal bleeding, likely due to external hemorrhoids.  He has had GI visit for it and was advised to take MiraLAX as needed for constipation.  He currently denies any rectal pain or bleeding.  Denies any melena, nausea or vomiting.  He smokes about 5 cigarette/day now. He has been stressed due to loss of his mother and wife in the last year. He is planning to join support group. Denies SI or HI currently.  He received flu vaccine today.     Past Medical History:  Diagnosis Date   Hep C w/o coma, chronic ([facility])     Past Surgical History:  Procedure Laterality Date   CHOLECYSTECTOMY     COLONOSCOPY WITH PROPOFOL N/A 06/11/2020   Procedure: COLONOSCOPY WITH PROPOFOL;  Surgeon: Dolores Frame, MD;  Location: [facility];  Service: Gastroenterology;  Laterality: N/A;  945   POLYPECTOMY  06/11/2020   Procedure: POLYPECTOMY;  Surgeon: Marguerita Merles, Reuel Boom, MD;  Location: [facility];  Service: Gastroenterology;;    Family History  Problem Relation Age of Onset   Diabetes Mother    Diabetes Father    Heart disease Father    Hepatitis C Father    Diabetes Sister    Psoriasis Sister     Social History   Socioeconomic History   Marital status: Married    Spouse name: Not on file   Number of children: Not on file   Years of education: Not on file   Highest education level: Not on file  Occupational History   Not on file  Tobacco Use   Smoking status: Every Day    Current packs/day: 0.25    Types: Cigarettes   Smokeless tobacco: Never   Tobacco comments:    Patient states he smokes one cigarette daily   Vaping Use   Vaping status: Never Used  Substance and Sexual Activity   Alcohol use: Yes    Comment: beer daily   Drug use: No   Sexual activity: Not on file  Other Topics Concern   Not on file  Social History Narrative   Not on file   Social Determinants of Health   Financial Resource Strain: Not on file  Food Insecurity: Not on file  Transportation Needs: Not on file  Physical Activity: Not on file  Stress: Not on file  Social Connections: Not on file  Intimate Partner Violence: Not on file    Outpatient Medications Prior to Visit  Medication Sig Dispense Refill   Multiple Vitamins-Minerals (MULTIVITAMIN WITH MINERALS) tablet Take 1 tablet by mouth daily.     naproxen (NAPROSYN) 500 MG tablet TAKE 1 TABLET BY MOUTH TWICE A DAY (Patient not taking: Reported on 11/13/2022) 60 tablet 5   MILK THISTLE PO Take 1 capsule by mouth daily. (Patient not taking: Reported on 11/13/2022)     No facility-administered medications prior to visit.    No Known Allergies  ROS Review of Systems  Constitutional:  Negative for chills and fever.  HENT:  Negative for congestion and sore throat.   Eyes:  Negative for pain and discharge.  Respiratory:  Negative for cough and shortness of breath.   Cardiovascular:  Negative for chest pain and palpitations.  Gastrointestinal:  Negative for constipation, diarrhea, nausea and vomiting.  Endocrine: Negative for polydipsia and polyuria.  Genitourinary:  Negative for dysuria and hematuria.  Musculoskeletal:  Negative for neck pain and neck stiffness.  Skin:  Negative for rash.  Neurological:  Negative for dizziness, weakness, numbness and headaches.  Psychiatric/Behavioral:  Negative for agitation and behavioral problems.       Objective:    Physical Exam Vitals reviewed.  Constitutional:      General: He is not in acute distress.    Appearance: He is not diaphoretic.  HENT:     Head: Normocephalic and atraumatic.     Nose: Nose normal.      Mouth/Throat:     Mouth: Mucous membranes are moist.  Eyes:     General: No scleral icterus.    Extraocular Movements: Extraocular movements intact.  Cardiovascular:     Rate and Rhythm: Normal rate and regular rhythm.     Pulses: Normal pulses.     Heart sounds: Normal heart sounds. No murmur heard. Pulmonary:     Breath sounds: Normal breath sounds. No wheezing or rales.  Abdominal:     Palpations: Abdomen is soft.     Tenderness: There is no abdominal tenderness.  Musculoskeletal:     Cervical back: Neck supple. No tenderness.     Right lower leg: No edema.     Left lower leg: No edema.  Skin:    General: Skin is warm.  Neurological:     General: No focal deficit present.     Mental Status: He is alert and oriented to person, place, and time.     Cranial Nerves: No cranial nerve deficit.     Sensory: No sensory deficit.     Motor: No weakness.  Psychiatric:        Mood and Affect: Mood normal.        Behavior: Behavior normal.     BP 108/70   Pulse 86   Ht 5\' 9"  (1.753 m)   Wt 226 lb 9.6 oz (102.8 kg)   SpO2 94%   BMI 33.46 kg/m  Wt Readings from Last 3 Encounters:  05/04/23 226 lb 9.6 oz (102.8 kg)  11/13/22 231 lb (104.8 kg)  09/15/22 234 lb (106.1 kg)    Lab Results  Component Value Date   TSH 4.150 06/10/2020   Lab Results  Component Value Date   WBC 7.5 03/01/2022   HGB 14.8 03/01/2022   HCT 43.3 03/01/2022   MCV 90.4 03/01/2022   PLT 239 03/01/2022   Lab Results  Component Value Date   NA 135 03/01/2022   K 4.2 03/01/2022   CO2 24 03/01/2022   GLUCOSE 120 (H) 03/01/2022   BUN 22 (H) 03/01/2022   CREATININE 0.82 03/01/2022   BILITOT 0.7 03/01/2022   ALKPHOS 80 03/01/2022   AST 28 03/01/2022   ALT 22 03/01/2022   PROT 7.2 03/01/2022   ALBUMIN 4.1 03/01/2022   CALCIUM 8.8 (L) 03/01/2022   ANIONGAP 7 03/01/2022   Lab Results  Component Value Date   CHOL 158 06/10/2020   Lab Results  Component Value Date   HDL 41 06/10/2020    Lab Results  Component Value Date   LDLCALC 91 06/10/2020   Lab Results  Component Value Date   TRIG 151 (H) 06/10/2020   Lab Results  Component Value Date  CHOLHDL 3.9 06/10/2020   Lab Results  Component Value Date   HGBA1C 5.8 (H) 06/10/2020      Assessment & Plan:   Problem List Items Addressed This Visit       Other   Encounter for general adult medical examination with abnormal findings - Primary    No orders of the defined types were placed in this encounter.   Follow-up: No follow-ups on file.    Anabel Halon, MD

## 2023-05-04 NOTE — Assessment & Plan Note (Signed)
BMI Readings from Last 3 Encounters:  05/04/23 33.46 kg/m  11/13/22 34.11 kg/m  09/15/22 34.56 kg/m   Diet modification and moderate exercise discussed Patient expressed understanding.

## 2023-05-04 NOTE — Assessment & Plan Note (Signed)

## 2023-05-04 NOTE — Assessment & Plan Note (Signed)
Ordered PSA after discussing its limitations for prostate cancer screening, including false positive results leading to additional investigations. 

## 2023-05-04 NOTE — Assessment & Plan Note (Signed)
Previously diagnosed in 1999, was confirmed to have positive RNA in 2017 - treated Follow up with GI Will check CMP for now

## 2023-05-04 NOTE — Assessment & Plan Note (Signed)
Lost his mother in 08/23 and wife in 09/23 Has been having grief reaction, although not affecting his functional status Offered [facility] therapy referral, but he prefers to join support group instead

## 2023-05-04 NOTE — Assessment & Plan Note (Signed)
Smokes 5 cigarette/day now  Asked about quitting: confirms they are currently smokeing cigarettes Advise to quit smoking: Educated about QUITTING to reduce the risk of cancer, cardio and cerebrovascular disease. Assess willingness: Willing to quit Assist with counseling and pharmacotherapy: Counseled for 5 minutes and literature provided. Arrange for follow up: Will follow up and continue to offer help.

## 2023-05-05 LAB — CMP14+EGFR
ALT: 14 IU/L (ref 0–44)
AST: 19 IU/L (ref 0–40)
Albumin: 4.6 g/dL (ref 3.9–4.9)
Alkaline Phosphatase: 136 IU/L — ABNORMAL HIGH (ref 44–121)
BUN/Creatinine Ratio: 16 (ref 10–24)
BUN: 14 mg/dL (ref 8–27)
Bilirubin Total: 0.3 mg/dL (ref 0.0–1.2)
CO2: 20 mmol/L (ref 20–29)
Calcium: 9.6 mg/dL (ref 8.6–10.2)
Chloride: 103 mmol/L (ref 96–106)
Creatinine, Ser: 0.86 mg/dL (ref 0.76–1.27)
Globulin, Total: 2.4 g/dL (ref 1.5–4.5)
Glucose: 120 mg/dL — ABNORMAL HIGH (ref 70–99)
Potassium: 4.3 mmol/L (ref 3.5–5.2)
Sodium: 139 mmol/L (ref 134–144)
Total Protein: 7 g/dL (ref 6.0–8.5)
eGFR: 99 mL/min/{1.73_m2} (ref >59)

## 2023-05-05 LAB — CBC WITH DIFFERENTIAL/PLATELET
Basophils Absolute: 0.1 10*3/uL (ref 0.0–0.2)
Basos: 1 %
EOS (ABSOLUTE): 0.2 10*3/uL (ref 0.0–0.4)
Eos: 3 %
Hematocrit: 43.5 % (ref 37.5–51.0)
Hemoglobin: 14.8 g/dL (ref 13.0–17.7)
Immature Grans (Abs): 0 10*3/uL (ref 0.0–0.1)
Immature Granulocytes: 0 %
Lymphocytes Absolute: 1.6 10*3/uL (ref 0.7–3.1)
Lymphs: 20 %
MCH: 30.6 pg (ref 26.6–33.0)
MCHC: 34 g/dL (ref 31.5–35.7)
MCV: 90 fL (ref 79–97)
Monocytes Absolute: 0.6 10*3/uL (ref 0.1–0.9)
Monocytes: 7 %
Neutrophils Absolute: 5.5 10*3/uL (ref 1.4–7.0)
Neutrophils: 69 %
Platelets: 250 10*3/uL (ref 150–450)
RBC: 4.84 x10E6/uL (ref 4.14–5.80)
RDW: 12.8 % (ref 11.6–15.4)
WBC: 7.9 10*3/uL (ref 3.4–10.8)

## 2023-05-05 LAB — LIPID PANEL
Chol/HDL Ratio: 5.2 ratio — ABNORMAL HIGH (ref 0.0–5.0)
Cholesterol, Total: 191 mg/dL (ref 100–199)
HDL: 37 mg/dL — ABNORMAL LOW (ref >39)
LDL Chol Calc (NIH): 91 mg/dL (ref 0–99)
Triglycerides: 382 mg/dL — ABNORMAL HIGH (ref 0–149)
VLDL Cholesterol Cal: 63 mg/dL — ABNORMAL HIGH (ref 5–40)

## 2023-05-05 LAB — HEMOGLOBIN A1C
Est. average glucose Bld gHb Est-mCnc: 126 mg/dL
Hgb A1c MFr Bld: 6 % — ABNORMAL HIGH (ref 4.8–5.6)

## 2023-05-05 LAB — TSH: TSH: 1.9 u[IU]/mL (ref 0.450–4.500)

## 2023-05-05 LAB — VITAMIN D 25 HYDROXY (VIT D DEFICIENCY, FRACTURES): Vit D, 25-Hydroxy: 24.4 ng/mL — ABNORMAL LOW (ref 30.0–100.0)

## 2023-05-05 LAB — PSA: Prostate Specific Ag, Serum: 0.5 ng/mL (ref 0.0–4.0)

## 2023-07-15 ENCOUNTER — Emergency Department (HOSPITAL_COMMUNITY): Payer: No Typology Code available for payment source

## 2023-07-15 ENCOUNTER — Encounter (HOSPITAL_COMMUNITY): Payer: Self-pay

## 2023-07-15 ENCOUNTER — Emergency Department (HOSPITAL_COMMUNITY)
Admission: EM | Admit: 2023-07-15 | Discharge: 2023-07-15 | Disposition: A | Discharge status: Home / Self Care | Payer: No Typology Code available for payment source | Attending: Emergency Medicine | Admitting: Emergency Medicine

## 2023-07-15 ENCOUNTER — Other Ambulatory Visit: Payer: Self-pay

## 2023-07-15 DIAGNOSIS — J181 Lobar pneumonia, unspecified organism: Secondary | ICD-10-CM | POA: Insufficient documentation

## 2023-07-15 DIAGNOSIS — Z20822 Contact with and (suspected) exposure to covid-19: Secondary | ICD-10-CM | POA: Insufficient documentation

## 2023-07-15 DIAGNOSIS — J189 Pneumonia, unspecified organism: Secondary | ICD-10-CM

## 2023-07-15 DIAGNOSIS — R531 Weakness: Secondary | ICD-10-CM | POA: Diagnosis not present

## 2023-07-15 DIAGNOSIS — R509 Fever, unspecified: Secondary | ICD-10-CM | POA: Diagnosis present

## 2023-07-15 LAB — CBC WITH DIFFERENTIAL/PLATELET
Abs Immature Granulocytes: 0.04 10*3/uL (ref 0.00–0.07)
Basophils Absolute: 0 10*3/uL (ref 0.0–0.1)
Basophils Relative: 0 %
Eosinophils Absolute: 0.1 10*3/uL (ref 0.0–0.5)
Eosinophils Relative: 0 %
HCT: 38.9 % — ABNORMAL LOW (ref 39.0–52.0)
Hemoglobin: 13.3 g/dL (ref 13.0–17.0)
Immature Granulocytes: 0 %
Lymphocytes Relative: 9 %
Lymphs Abs: 1 10*3/uL (ref 0.7–4.0)
MCH: 30.1 pg (ref 26.0–34.0)
MCHC: 34.2 g/dL (ref 30.0–36.0)
MCV: 88 fL (ref 80.0–100.0)
Monocytes Absolute: 0.9 10*3/uL (ref 0.1–1.0)
Monocytes Relative: 8 %
Neutro Abs: 9.1 10*3/uL — ABNORMAL HIGH (ref 1.7–7.7)
Neutrophils Relative %: 83 %
Platelets: 227 10*3/uL (ref 150–400)
RBC: 4.42 MIL/uL (ref 4.22–5.81)
RDW: 12.4 % (ref 11.5–15.5)
WBC: 11.1 10*3/uL — ABNORMAL HIGH (ref 4.0–10.5)
nRBC: 0 % (ref 0.0–0.2)

## 2023-07-15 LAB — COMPREHENSIVE METABOLIC PANEL
ALT: 43 U/L (ref 0–44)
AST: 38 U/L (ref 15–41)
Albumin: 3.7 g/dL (ref 3.5–5.0)
Alkaline Phosphatase: 108 U/L (ref 38–126)
Anion gap: 9 (ref 5–15)
BUN: 14 mg/dL (ref 8–23)
CO2: 21 mmol/L — ABNORMAL LOW (ref 22–32)
Calcium: 8.6 mg/dL — ABNORMAL LOW (ref 8.9–10.3)
Chloride: 103 mmol/L (ref 98–111)
Creatinine, Ser: 0.85 mg/dL (ref 0.61–1.24)
GFR, Estimated: >60 mL/min (ref >60)
Glucose, Bld: 118 mg/dL — ABNORMAL HIGH (ref 70–99)
Potassium: 3.9 mmol/L (ref 3.5–5.1)
Sodium: 133 mmol/L — ABNORMAL LOW (ref 135–145)
Total Bilirubin: 1.3 mg/dL — ABNORMAL HIGH (ref <1.2)
Total Protein: 7.6 g/dL (ref 6.5–8.1)

## 2023-07-15 LAB — RESP PANEL BY RT-PCR (RSV, FLU A&B, COVID)  RVPGX2
Influenza A by PCR: NEGATIVE
Influenza B by PCR: NEGATIVE
Resp Syncytial Virus by PCR: NEGATIVE
SARS Coronavirus 2 by RT PCR: NEGATIVE

## 2023-07-15 LAB — LACTIC ACID, PLASMA
Lactic Acid, Venous: 1.1 mmol/L (ref 0.5–1.9)
Lactic Acid, Venous: 1 mmol/L (ref 0.5–1.9)

## 2023-07-15 MED ORDER — SODIUM CHLORIDE 0.9 % IV SOLN
2.0000 g | Freq: Once | INTRAVENOUS | Status: AC
  Administered 2023-07-15: 2 g via INTRAVENOUS
  Filled 2023-07-15: qty 20

## 2023-07-15 MED ORDER — SODIUM CHLORIDE 0.9 % IV BOLUS
1000.0000 mL | Freq: Once | INTRAVENOUS | Status: AC
  Administered 2023-07-15: 1000 mL via INTRAVENOUS

## 2023-07-15 MED ORDER — DOXYCYCLINE HYCLATE 100 MG PO CAPS: 100.0000 mg | ORAL_CAPSULE | Freq: Two times a day (BID) | ORAL | 0 refills | Status: DC

## 2023-07-15 MED ORDER — SODIUM CHLORIDE 0.9 % IV SOLN
500.0000 mg | Freq: Once | INTRAVENOUS | Status: AC
  Administered 2023-07-15: 500 mg via INTRAVENOUS
  Filled 2023-07-15: qty 5

## 2023-07-15 NOTE — ED Provider Notes (Signed)
[place] EMERGENCY DEPARTMENT AT [facility] Provider Note   CSN: [id number] Arrival date & time: 07/15/23  1638     History  Chief Complaint  Patient presents with   Weakness    Caleb Hunt is a 62 y.o. male.  Patient has no medical history.  He complains of fever and cough  The history is provided by the patient. No language interpreter was used.  Cough Cough characteristics:  Productive Sputum characteristics:  Nondescript Severity:  Moderate Onset quality:  Sudden Timing:  Constant Progression:  Worsening Chronicity:  New Smoker: no   Associated symptoms: no chest pain, no eye discharge, no headaches and no rash        Home Medications Prior to Admission medications   Medication Sig Start Date End Date Taking? Authorizing Provider  acetaminophen (TYLENOL) 500 MG tablet Take 500 mg by mouth every 6 (six) hours as needed for mild pain (pain score 1-3) or fever.   Yes [provider]  doxycycline (VIBRAMYCIN) 100 MG capsule Take 1 capsule (100 mg total) by mouth 2 (two) times daily. One po bid x 7 days 07/15/23  Yes Bethann Berkshire, MD  Multiple Vitamins-Minerals (MULTIVITAMIN WITH MINERALS) tablet Take 1 tablet by mouth daily.   Yes [provider]      Allergies    Patient has no known allergies.    Review of Systems   Review of Systems  Constitutional:  Negative for appetite change and fatigue.  HENT:  Negative for congestion, ear discharge and sinus pressure.   Eyes:  Negative for discharge.  Respiratory:  Positive for cough.   Cardiovascular:  Negative for chest pain.  Gastrointestinal:  Negative for abdominal pain and diarrhea.  Genitourinary:  Negative for frequency and hematuria.  Musculoskeletal:  Negative for back pain.  Skin:  Negative for rash.  Neurological:  Negative for seizures and headaches.  Psychiatric/Behavioral:  Negative for hallucinations.     Physical Exam Updated Vital Signs BP 114/76   Pulse 90    Temp 98.4 F (36.9 C) (Oral)   Resp (!) 22   Ht 5\' 9"  (1.753 m)   Wt 104.3 kg   SpO2 97%   BMI 33.97 kg/m  Physical Exam Vitals and nursing note reviewed.  Constitutional:      Appearance: He is well-developed.  HENT:     Head: Normocephalic.     Nose: Nose normal.  Eyes:     General: No scleral icterus.    Conjunctiva/sclera: Conjunctivae normal.  Neck:     Thyroid: No thyromegaly.  Cardiovascular:     Rate and Rhythm: Normal rate and regular rhythm.     Heart sounds: No murmur heard.    No friction rub. No gallop.  Pulmonary:     Breath sounds: No stridor. No wheezing or rales.  Chest:     Chest wall: No tenderness.  Abdominal:     General: There is no distension.     Tenderness: There is no abdominal tenderness. There is no rebound.  Musculoskeletal:        General: Normal range of motion.     Cervical back: Neck supple.  Lymphadenopathy:     Cervical: No cervical adenopathy.  Skin:    Findings: No erythema or rash.  Neurological:     Mental Status: He is alert and oriented to person, place, and time.     Motor: No abnormal muscle tone.     Coordination: Coordination normal.  Psychiatric:  Behavior: Behavior normal.     ED Results / Procedures / Treatments   Labs (all labs ordered are listed, but only abnormal results are displayed) Labs Reviewed  CBC WITH DIFFERENTIAL/PLATELET - Abnormal; Notable for the following components:      Result Value   WBC 11.1 (*)    HCT 38.9 (*)    Neutro Abs 9.1 (*)    All other components within normal limits  COMPREHENSIVE METABOLIC PANEL - Abnormal; Notable for the following components:   Sodium 133 (*)    CO2 21 (*)    Glucose, Bld 118 (*)    Calcium 8.6 (*)    Total Bilirubin 1.3 (*)    All other components within normal limits  RESP PANEL BY RT-PCR (RSV, FLU A&B, COVID)  RVPGX2  CULTURE, BLOOD (ROUTINE X 2)  CULTURE, BLOOD (ROUTINE X 2)  LACTIC ACID, PLASMA  LACTIC ACID, PLASMA     EKG None  Radiology DG Chest 2 View  Result Date: 07/15/2023 CLINICAL DATA:  Cough. EXAM: CHEST - 2 VIEW COMPARISON:  None Available. FINDINGS: The heart size and mediastinal contours are within normal limits. Left lung is clear. Right upper lobe airspace opacity is noted concerning for pneumonia or atelectasis, but underlying obstructive neoplasm cannot be excluded. The visualized skeletal structures are unremarkable. IMPRESSION: Right upper lobe airspace opacity is noted concerning for pneumonia or atelectasis, but underlying obstructive neoplasm cannot be excluded. CT scan of the chest with intravenous contrast is recommended for further evaluation. Electronically Signed   By: Lupita Raider M.D.   On: 07/15/2023 18:12    Procedures Procedures    Medications Ordered in ED Medications  sodium chloride 0.9 % bolus 1,000 mL (0 mLs Intravenous Stopped 07/15/23 2203)  cefTRIAXone (ROCEPHIN) 2 g in sodium chloride 0.9 % 100 mL IVPB (0 g Intravenous Stopped 07/15/23 2134)  azithromycin (ZITHROMAX) 500 mg in sodium chloride 0.9 % 250 mL IVPB (0 mg Intravenous Stopped 07/15/23 2241)    ED Course/ Medical Decision Making/ A&P                                 Medical Decision Making Amount and/or Complexity of Data Reviewed Labs: ordered. Radiology: ordered.  Risk Prescription drug management.  This patient presents to the ED for concern of cough, this involves an extensive number of treatment options, and is a complaint that carries with it a high risk of complications and morbidity.  The differential diagnosis includes pneumonia PE   Co morbidities that complicate the patient evaluation  None   Additional history obtained:  Additional history obtained from patient External records from outside source obtained and reviewed including hospital records   Lab Tests:  I Ordered, and personally interpreted labs.  The pertinent results include: White count 11.1   Imaging  Studies ordered:  I ordered imaging studies including chest x-ray I independently visualized and interpreted imaging which showed right upper lobe pneumonia I agree with the radiologist interpretation   Cardiac Monitoring: / EKG:  The patient was maintained on a cardiac monitor.  I personally viewed and interpreted the cardiac monitored which showed an underlying rhythm of: Normal sinus rhythm   Consultations Obtained:  No consultant  Problem List / ED Course / Critical interventions / Medication management  Pneumonia I ordered medication including Rocephin and Zithromax Reevaluation of the patient after these medicines showed that the patient improved I have reviewed the patients  home medicines and have made adjustments as needed   Social Determinants of Health:  None   Test / Admission - Considered:  None  Patient with pneumonia.  Patient is nontoxic and will be started on doxycycline and will follow-up with PCP        Final Clinical Impression(s) / ED Diagnoses Final diagnoses:  Community acquired pneumonia of right upper lobe of lung    Rx / DC Orders ED Discharge Orders          Ordered    doxycycline (VIBRAMYCIN) 100 MG capsule  2 times daily        07/15/23 2309              Bethann Berkshire, MD 07/16/23 1138

## 2023-07-15 NOTE — ED Triage Notes (Signed)
Weakness, cough and fever 101 yesterday Dark urine  Took Tylenol at home

## 2023-07-15 NOTE — Discharge Instructions (Signed)
Take Tylenol or Motrin for fevers and aches.  Drink plenty of fluids.  Follow-up with your family doctor next week for recheck

## 2023-07-18 NOTE — Progress Notes (Signed)
   Established Patient Office Visit   Subjective  Patient ID: Caleb Hunt, male    DOB: 02/22/1961  Age: 62 y.o. MRN: [id number]  No chief complaint on file.   He  has a past medical history of Hep C w/o coma, chronic (HCC).  HPI  ROS    Objective:     There were no vitals taken for this visit. {Vitals History (Optional):23777}  Physical Exam   No results found for any visits on 07/19/23.  The 10-year ASCVD risk score (Arnett DK, et al., 2019) is: 14.6%    Assessment & Plan:  There are no diagnoses linked to this encounter.  No follow-ups on file.   Cruzita Lederer Newman Nip, FNP

## 2023-07-18 NOTE — Patient Instructions (Signed)

## 2023-07-19 ENCOUNTER — Ambulatory Visit (INDEPENDENT_AMBULATORY_CARE_PROVIDER_SITE_OTHER): Payer: No Typology Code available for payment source | Admitting: Family Medicine

## 2023-07-19 ENCOUNTER — Encounter: Payer: Self-pay | Admitting: Family Medicine

## 2023-07-19 VITALS — BP 111/71 | HR 71 | Ht 69.0 in | Wt 221.1 lb

## 2023-07-19 DIAGNOSIS — Z09 Encounter for follow-up examination after completed treatment for conditions other than malignant neoplasm: Secondary | ICD-10-CM

## 2023-07-19 DIAGNOSIS — J189 Pneumonia, unspecified organism: Secondary | ICD-10-CM

## 2023-07-19 MED ORDER — BENZONATATE 200 MG PO CAPS: 200.0000 mg | ORAL_CAPSULE | Freq: Two times a day (BID) | ORAL | 0 refills | Status: DC | PRN

## 2023-07-19 NOTE — Assessment & Plan Note (Addendum)
Patient reports feeling significantly better following a visit to the Emergency Department for pneumonia. He his currently prescribed course of doxycycline 100 mg twice daily for 7 days without any reported adverse events. The patient is recovering well and shows continued improvement in symptoms. No complications or new concerns were noted during this follow-up evaluation. Can take benzonatate 200 mg PRN for cough BMP and CBC labs ordered.

## 2023-07-20 LAB — CULTURE, BLOOD (ROUTINE X 2)
Culture: NO GROWTH
Culture: NO GROWTH

## 2023-07-30 LAB — BMP8+EGFR
BUN/Creatinine Ratio: 18 (ref 10–24)
BUN: 16 mg/dL (ref 8–27)
CO2: 21 mmol/L (ref 20–29)
Calcium: 9.7 mg/dL (ref 8.6–10.2)
Chloride: 100 mmol/L (ref 96–106)
Creatinine, Ser: 0.89 mg/dL (ref 0.76–1.27)
Glucose: 105 mg/dL — ABNORMAL HIGH (ref 70–99)
Potassium: 4.6 mmol/L (ref 3.5–5.2)
Sodium: 137 mmol/L (ref 134–144)
eGFR: 97 mL/min/{1.73_m2} (ref >59)

## 2023-07-30 LAB — CBC WITH DIFFERENTIAL/PLATELET
Basophils Absolute: 0.1 10*3/uL (ref 0.0–0.2)
Basos: 1 %
EOS (ABSOLUTE): 0.1 10*3/uL (ref 0.0–0.4)
Eos: 2 %
Hematocrit: 44.5 % (ref 37.5–51.0)
Hemoglobin: 14.8 g/dL (ref 13.0–17.7)
Immature Grans (Abs): 0 10*3/uL (ref 0.0–0.1)
Immature Granulocytes: 0 %
Lymphocytes Absolute: 1.7 10*3/uL (ref 0.7–3.1)
Lymphs: 32 %
MCH: 29.7 pg (ref 26.6–33.0)
MCHC: 33.3 g/dL (ref 31.5–35.7)
MCV: 89 fL (ref 79–97)
Monocytes Absolute: 0.4 10*3/uL (ref 0.1–0.9)
Monocytes: 7 %
Neutrophils Absolute: 3.2 10*3/uL (ref 1.4–7.0)
Neutrophils: 58 %
Platelets: 385 10*3/uL (ref 150–450)
RBC: 4.98 x10E6/uL (ref 4.14–5.80)
RDW: 12.7 % (ref 11.6–15.4)
WBC: 5.5 10*3/uL (ref 3.4–10.8)

## 2023-09-22 ENCOUNTER — Ambulatory Visit (HOSPITAL_COMMUNITY)
Admission: RE | Admit: 2023-09-22 | Discharge: 2023-09-22 | Disposition: A | Discharge status: Home / Self Care | Payer: Worker's Compensation | Source: Ambulatory Visit | Attending: Preventative Medicine | Admitting: Preventative Medicine

## 2023-09-22 ENCOUNTER — Other Ambulatory Visit: Payer: Self-pay

## 2023-09-22 ENCOUNTER — Encounter (HOSPITAL_COMMUNITY): Payer: Self-pay

## 2023-09-22 ENCOUNTER — Emergency Department (HOSPITAL_COMMUNITY): Payer: No Typology Code available for payment source

## 2023-09-22 ENCOUNTER — Other Ambulatory Visit (HOSPITAL_COMMUNITY): Payer: Self-pay | Admitting: Preventative Medicine

## 2023-09-22 ENCOUNTER — Emergency Department (HOSPITAL_COMMUNITY)
Admission: EM | Admit: 2023-09-22 | Discharge: 2023-09-22 | Discharge status: Against Medical Advice | Payer: Worker's Compensation | Attending: Preventative Medicine | Admitting: Preventative Medicine

## 2023-09-22 DIAGNOSIS — R0789 Other chest pain: Secondary | ICD-10-CM | POA: Insufficient documentation

## 2023-09-22 DIAGNOSIS — R0781 Pleurodynia: Secondary | ICD-10-CM | POA: Insufficient documentation

## 2023-09-22 DIAGNOSIS — W19XXXA Unspecified fall, initial encounter: Secondary | ICD-10-CM | POA: Insufficient documentation

## 2023-09-22 DIAGNOSIS — Y990 Civilian activity done for income or pay: Secondary | ICD-10-CM | POA: Insufficient documentation

## 2023-09-22 DIAGNOSIS — Z5321 Procedure and treatment not carried out due to patient leaving prior to being seen by health care provider: Secondary | ICD-10-CM | POA: Diagnosis not present

## 2023-09-22 NOTE — ED Triage Notes (Signed)
Pt arrived via POV c/o left rib cage pain following a fall at work. Pt reports being sent to UC, but UC was unable to perform the xray. Pt now here for workers comp.

## 2024-05-08 ENCOUNTER — Encounter: Payer: No Typology Code available for payment source | Admitting: Internal Medicine

## 2024-05-16 ENCOUNTER — Encounter: Payer: Self-pay | Admitting: Internal Medicine

## 2024-06-26 ENCOUNTER — Ambulatory Visit (INDEPENDENT_AMBULATORY_CARE_PROVIDER_SITE_OTHER): Payer: Self-pay | Admitting: Internal Medicine

## 2024-06-26 VITALS — BP 122/78 | HR 98 | Ht 69.0 in | Wt 241.4 lb

## 2024-06-26 DIAGNOSIS — Z0001 Encounter for general adult medical examination with abnormal findings: Secondary | ICD-10-CM

## 2024-06-26 DIAGNOSIS — Z125 Encounter for screening for malignant neoplasm of prostate: Secondary | ICD-10-CM

## 2024-06-26 DIAGNOSIS — E782 Mixed hyperlipidemia: Secondary | ICD-10-CM

## 2024-06-26 DIAGNOSIS — E669 Obesity, unspecified: Secondary | ICD-10-CM

## 2024-06-26 DIAGNOSIS — Z23 Encounter for immunization: Secondary | ICD-10-CM

## 2024-06-26 DIAGNOSIS — E559 Vitamin D deficiency, unspecified: Secondary | ICD-10-CM

## 2024-06-26 DIAGNOSIS — B182 Chronic viral hepatitis C: Secondary | ICD-10-CM

## 2024-06-26 DIAGNOSIS — R7303 Prediabetes: Secondary | ICD-10-CM | POA: Insufficient documentation

## 2024-06-26 DIAGNOSIS — K429 Umbilical hernia without obstruction or gangrene: Secondary | ICD-10-CM

## 2024-06-26 DIAGNOSIS — Z720 Tobacco use: Secondary | ICD-10-CM

## 2024-06-26 DIAGNOSIS — K644 Residual hemorrhoidal skin tags: Secondary | ICD-10-CM

## 2024-06-26 NOTE — Assessment & Plan Note (Signed)
 Physical exam as documented. Counseling done  re healthy lifestyle involving commitment to 150 minutes exercise per week, heart healthy diet, and attaining healthy weight.The importance of adequate sleep also discussed. Immunization and cancer screening needs are specifically addressed at this visit.

## 2024-06-26 NOTE — Assessment & Plan Note (Signed)
Now improved Uses MiraLAX as needed for constipation

## 2024-06-26 NOTE — Assessment & Plan Note (Signed)
 BMI Readings from Last 3 Encounters:  06/26/24 35.65 kg/m  09/22/23 32.56 kg/m  07/19/23 32.65 kg/m   Diet modification and moderate exercise discussed Patient expressed understanding.

## 2024-06-26 NOTE — Assessment & Plan Note (Signed)
Previously diagnosed in 1999, was confirmed to have positive RNA in 2017 - treated Follow up with GI Will check CMP for now

## 2024-06-26 NOTE — Assessment & Plan Note (Signed)
 Ordered PSA after discussing its limitations for prostate cancer screening, including false positive results leading to additional investigations.

## 2024-06-26 NOTE — Assessment & Plan Note (Addendum)
 Appears to have benign umbilical hernia Reassured for now Advised to contact if he has local pain or discomfort Avoid heavy lifting

## 2024-06-26 NOTE — Assessment & Plan Note (Signed)
 Smokes 2 cigarette/day now  Asked about quitting: confirms they are currently smokeing cigarettes Advise to quit smoking: Educated about QUITTING to reduce the risk of cancer, cardio and cerebrovascular disease. Assess willingness: Willing to quit Assist with counseling and pharmacotherapy: Counseled for 2 minutes and literature provided. Arrange for follow up: Will follow up and continue to offer help.

## 2024-06-26 NOTE — Patient Instructions (Signed)
 Please avoid heavy lifting more than 20 lbs.  Please continue to follow low carb diet and perform moderate exercise/walking at least 150 mins/week.

## 2024-06-26 NOTE — Assessment & Plan Note (Signed)
 Lab Results  Component Value Date   HGBA1C 6.0 (H) 05/04/2023   Advised to follow low-carb diet

## 2024-06-26 NOTE — Progress Notes (Signed)
 Established Patient Office Visit  Subjective:  Patient ID: Caleb Hunt, male    DOB: 25-Mar-1961  Age: 63 y.o. MRN: [id number]  CC:  Chief Complaint  Patient presents with   Annual Exam    CPE    Hernia    Has hernia on his belly button ongoing for 1 year.     HPI Caleb Hunt is a 63 y.o. male with past medical history of chronic hep C and tobacco use who presents for annual physical.  Umbilical hernia: He reports having a bulging near the umbilicus, which he has noticed for the last 1 year.  He first noticed it after moving an heavy object at his workplace.  Denies any local pain currently.  Denies any recent change in bowel habits, nausea or vomiting.  He has been trying to avoid heavy lifting now.  He has also tried to use abdominal belt for hernia.  He has history of rectal bleeding, likely due to external hemorrhoids.  He has had GI visit for it and was advised to take MiraLAX  as needed for constipation.  He currently denies any rectal pain or bleeding.  Denies any melena, nausea or vomiting.  He smokes about 2 cigarette/day now. He was stressed due to loss of his mother and wife in 2023, but is feeling better now. Denies SI or HI currently.  He received flu vaccine today.  He prefers to wait for pneumococcal and Shingrix vaccine for now.     Past Medical History:  Diagnosis Date   Hep C w/o coma, chronic (HCC)     Past Surgical History:  Procedure Laterality Date   CHOLECYSTECTOMY     COLONOSCOPY WITH PROPOFOL  N/A 06/11/2020   Procedure: COLONOSCOPY WITH PROPOFOL ;  Surgeon: Eartha Angelia Sieving, MD;  Location: [facility];  Service: Gastroenterology;  Laterality: N/A;  945   POLYPECTOMY  06/11/2020   Procedure: POLYPECTOMY;  Surgeon: Eartha Angelia, Sieving, MD;  Location: [facility];  Service: Gastroenterology;;    Family History  Problem Relation Age of Onset   Diabetes Mother    Diabetes Father    Heart disease Father    Hepatitis C Father     Diabetes Sister    Psoriasis Sister     Social History   Socioeconomic History   Marital status: Married    Spouse name: Not on file   Number of children: Not on file   Years of education: Not on file   Highest education level: GED or equivalent  Occupational History   Not on file  Tobacco Use   Smoking status: Every Day    Current packs/day: 0.25    Types: Cigarettes   Smokeless tobacco: Never   Tobacco comments:    Patient states he smokes one cigarette daily  Vaping Use   Vaping status: Never Used  Substance and Sexual Activity   Alcohol use: Yes    Comment: beer daily   Drug use: No   Sexual activity: Not on file  Other Topics Concern   Not on file  Social History Narrative   Not on file   Social Drivers of Health   Financial Resource Strain: Low Risk  (06/26/2024)   Overall Financial Resource Strain (CARDIA)    Difficulty of Paying Living Expenses: Not very hard  Food Insecurity: No Food Insecurity (06/26/2024)   Hunger Vital Sign    Worried About Running Out of Food in the Last Year: Never true    Ran Out of Food in  the Last Year: Never true  Transportation Needs: No Transportation Needs (06/26/2024)   PRAPARE - Administrator, Civil Service (Medical): No    Lack of Transportation (Non-Medical): No  Physical Activity: Inactive (06/26/2024)   Exercise Vital Sign    Days of Exercise per Week: 0 days    Minutes of Exercise per Session: Not on file  Stress: Patient Declined (06/26/2024)   [employer] of Occupational Health - Occupational Stress Questionnaire    Feeling of Stress: Patient declined  Social Connections: Socially Isolated (06/26/2024)   Social Connection and Isolation Panel    Frequency of Communication with Friends and Family: Three times a week    Frequency of Social Gatherings with Friends and Family: Once a week    Attends Religious Services: Patient declined    Database Administrator or Organizations: No    Attends Museum/gallery Exhibitions Officer: Not on file    Marital Status: Widowed  Intimate Partner Violence: Not on file    Outpatient Medications Prior to Visit  Medication Sig Dispense Refill   acetaminophen (TYLENOL) 500 MG tablet Take 500 mg by mouth every 6 (six) hours as needed for mild pain (pain score 1-3) or fever.     Multiple Vitamins-Minerals (MULTIVITAMIN WITH MINERALS) tablet Take 1 tablet by mouth daily.     benzonatate  (TESSALON ) 200 MG capsule Take 1 capsule (200 mg total) by mouth 2 (two) times daily as needed for cough. 20 capsule 0   doxycycline  (VIBRAMYCIN ) 100 MG capsule Take 1 capsule (100 mg total) by mouth 2 (two) times daily. One po bid x 7 days 20 capsule 0   No facility-administered medications prior to visit.    No Known Allergies  ROS Review of Systems  Constitutional:  Negative for chills and fever.  HENT:  Negative for congestion and sore throat.   Eyes:  Negative for pain and discharge.  Respiratory:  Negative for cough and shortness of breath.   Cardiovascular:  Negative for chest pain and palpitations.  Gastrointestinal:  Negative for constipation, diarrhea, nausea and vomiting.  Endocrine: Negative for polydipsia and polyuria.  Genitourinary:  Negative for dysuria and hematuria.  Musculoskeletal:  Negative for neck pain and neck stiffness.  Skin:  Negative for rash.  Neurological:  Negative for dizziness and weakness.  Psychiatric/Behavioral:  Negative for agitation and behavioral problems.       Objective:    Physical Exam Vitals reviewed.  Constitutional:      General: He is not in acute distress.    Appearance: He is not diaphoretic.  HENT:     Head: Normocephalic and atraumatic.     Nose: Nose normal.     Mouth/Throat:     Mouth: Mucous membranes are moist.  Eyes:     General: No scleral icterus.    Extraocular Movements: Extraocular movements intact.  Cardiovascular:     Rate and Rhythm: Normal rate and regular rhythm.     Heart sounds: Normal  heart sounds. No murmur heard. Pulmonary:     Breath sounds: Normal breath sounds. No wheezing or rales.  Abdominal:     Palpations: Abdomen is soft.     Tenderness: There is no abdominal tenderness.  Musculoskeletal:     Cervical back: Neck supple. No tenderness.     Right lower leg: No edema.     Left lower leg: No edema.  Skin:    General: Skin is warm.  Neurological:     General: No focal  deficit present.     Mental Status: He is alert and oriented to person, place, and time.     Cranial Nerves: No cranial nerve deficit.     Sensory: No sensory deficit.     Motor: No weakness.  Psychiatric:        Mood and Affect: Mood normal.        Behavior: Behavior normal.     BP 122/78   Pulse 98   Ht 5' 9 (1.753 m)   Wt 241 lb 6.4 oz (109.5 kg)   SpO2 96%   BMI 35.65 kg/m  Wt Readings from Last 3 Encounters:  06/26/24 241 lb 6.4 oz (109.5 kg)  09/22/23 220 lb 7.4 oz (100 kg)  07/19/23 221 lb 1.3 oz (100.3 kg)    Lab Results  Component Value Date   TSH 1.900 05/04/2023   Lab Results  Component Value Date   WBC 5.5 07/29/2023   HGB 14.8 07/29/2023   HCT 44.5 07/29/2023   MCV 89 07/29/2023   PLT 385 07/29/2023   Lab Results  Component Value Date   NA 137 07/29/2023   K 4.6 07/29/2023   CO2 21 07/29/2023   GLUCOSE 105 (H) 07/29/2023   BUN 16 07/29/2023   CREATININE 0.89 07/29/2023   BILITOT 1.3 (H) 07/15/2023   ALKPHOS 108 07/15/2023   AST 38 07/15/2023   ALT 43 07/15/2023   PROT 7.6 07/15/2023   ALBUMIN 3.7 07/15/2023   CALCIUM 9.7 07/29/2023   ANIONGAP 9 07/15/2023   EGFR 97 07/29/2023   Lab Results  Component Value Date   CHOL 191 05/04/2023   Lab Results  Component Value Date   HDL 37 (L) 05/04/2023   Lab Results  Component Value Date   LDLCALC 91 05/04/2023   Lab Results  Component Value Date   TRIG 382 (H) 05/04/2023   Lab Results  Component Value Date   CHOLHDL 5.2 (H) 05/04/2023   Lab Results  Component Value Date   HGBA1C 6.0 (H)  05/04/2023      Assessment & Plan:   Problem List Items Addressed This Visit       Cardiovascular and Mediastinum   External hemorrhoids   Now improved Uses MiraLAX  as needed for constipation        Digestive   Chronic hepatitis C (HCC)   Previously diagnosed in 1999, was confirmed to have positive RNA in 2017 - treated Follow up with GI Will check CMP for now      Relevant Orders   CMP14+EGFR   CBC with Differential/Platelet     Other   Obesity (BMI 30-39.9)   BMI Readings from Last 3 Encounters:  06/26/24 35.65 kg/m  09/22/23 32.56 kg/m  07/19/23 32.65 kg/m   Diet modification and moderate exercise discussed Patient expressed understanding.      Relevant Orders   TSH   Tobacco abuse   Smokes 2 cigarette/day now  Asked about quitting: confirms they are currently smokeing cigarettes Advise to quit smoking: Educated about QUITTING to reduce the risk of cancer, cardio and cerebrovascular disease. Assess willingness: Willing to quit Assist with counseling and pharmacotherapy: Counseled for 2 minutes and literature provided. Arrange for follow up: Will follow up and continue to offer help.      Umbilical hernia   Appears to have benign umbilical hernia Reassured for now Advised to contact if he has local pain or discomfort Avoid heavy lifting      Encounter for general adult medical examination  with abnormal findings - Primary   Physical exam as documented. Counseling done  re healthy lifestyle involving commitment to 150 minutes exercise per week, heart healthy diet, and attaining healthy weight.The importance of adequate sleep also discussed. Immunization and cancer screening needs are specifically addressed at this visit.      Prostate cancer screening   Ordered PSA after discussing its limitations for prostate cancer screening, including false positive results leading to additional investigations.      Relevant Orders   PSA   Prediabetes   Lab  Results  Component Value Date   HGBA1C 6.0 (H) 05/04/2023   Advised to follow low-carb diet      Relevant Orders   Hemoglobin A1c   CMP14+EGFR   Other Visit Diagnoses       Vitamin D  deficiency       Relevant Orders   VITAMIN D  25 Hydroxy (Vit-D Deficiency, Fractures)     Mixed hyperlipidemia       Relevant Orders   Lipid panel     Encounter for immunization       Relevant Orders   Flu vaccine HIGH DOSE PF(Fluzone Trivalent) (Completed)       No orders of the defined types were placed in this encounter.   Follow-up: Return in about 1 year (around 06/26/2025).    Suzzane MARLA Blanch, MD

## 2024-06-27 ENCOUNTER — Ambulatory Visit: Payer: Self-pay | Admitting: Internal Medicine

## 2024-06-27 LAB — CMP14+EGFR
ALT: 20 IU/L (ref 0–44)
AST: 23 IU/L (ref 0–40)
Albumin: 4.8 g/dL (ref 3.9–4.9)
Alkaline Phosphatase: 124 IU/L — ABNORMAL HIGH (ref 47–123)
BUN/Creatinine Ratio: 16 (ref 10–24)
BUN: 14 mg/dL (ref 8–27)
Bilirubin Total: 0.4 mg/dL (ref 0.0–1.2)
CO2: 22 mmol/L (ref 20–29)
Calcium: 9.9 mg/dL (ref 8.6–10.2)
Chloride: 101 mmol/L (ref 96–106)
Creatinine, Ser: 0.87 mg/dL (ref 0.76–1.27)
Globulin, Total: 2.7 g/dL (ref 1.5–4.5)
Glucose: 113 mg/dL — ABNORMAL HIGH (ref 70–99)
Potassium: 4.3 mmol/L (ref 3.5–5.2)
Sodium: 138 mmol/L (ref 134–144)
Total Protein: 7.5 g/dL (ref 6.0–8.5)
eGFR: 97 mL/min/1.73 (ref >59)

## 2024-06-27 LAB — CBC WITH DIFFERENTIAL/PLATELET
Basophils Absolute: 0.1 x10E3/uL (ref 0.0–0.2)
Basos: 1 %
EOS (ABSOLUTE): 0.2 x10E3/uL (ref 0.0–0.4)
Eos: 2 %
Hematocrit: 46.3 % (ref 37.5–51.0)
Hemoglobin: 16 g/dL (ref 13.0–17.7)
Immature Grans (Abs): 0 x10E3/uL (ref 0.0–0.1)
Immature Granulocytes: 0 %
Lymphocytes Absolute: 1.7 x10E3/uL (ref 0.7–3.1)
Lymphs: 22 %
MCH: 30.6 pg (ref 26.6–33.0)
MCHC: 34.6 g/dL (ref 31.5–35.7)
MCV: 89 fL (ref 79–97)
Monocytes Absolute: 0.6 x10E3/uL (ref 0.1–0.9)
Monocytes: 8 %
Neutrophils Absolute: 5.2 x10E3/uL (ref 1.4–7.0)
Neutrophils: 67 %
Platelets: 256 x10E3/uL (ref 150–450)
RBC: 5.23 x10E6/uL (ref 4.14–5.80)
RDW: 12.7 % (ref 11.6–15.4)
WBC: 7.8 x10E3/uL (ref 3.4–10.8)

## 2024-06-27 LAB — HEMOGLOBIN A1C
Est. average glucose Bld gHb Est-mCnc: 134 mg/dL
Hgb A1c MFr Bld: 6.3 % — ABNORMAL HIGH (ref 4.8–5.6)

## 2024-06-27 LAB — LIPID PANEL
Chol/HDL Ratio: 4.8 ratio (ref 0.0–5.0)
Cholesterol, Total: 231 mg/dL — ABNORMAL HIGH (ref 100–199)
HDL: 48 mg/dL (ref >39)
LDL Chol Calc (NIH): 115 mg/dL — ABNORMAL HIGH (ref 0–99)
Triglycerides: 391 mg/dL — ABNORMAL HIGH (ref 0–149)
VLDL Cholesterol Cal: 68 mg/dL — ABNORMAL HIGH (ref 5–40)

## 2024-06-27 LAB — VITAMIN D 25 HYDROXY (VIT D DEFICIENCY, FRACTURES): Vit D, 25-Hydroxy: 24.3 ng/mL — ABNORMAL LOW (ref 30.0–100.0)

## 2024-06-27 LAB — PSA: Prostate Specific Ag, Serum: 0.6 ng/mL (ref 0.0–4.0)

## 2024-06-27 LAB — TSH: TSH: 3.1 u[IU]/mL (ref 0.450–4.500)

## 2024-07-11 ENCOUNTER — Encounter: Payer: Self-pay | Admitting: Internal Medicine

## 2024-07-11 ENCOUNTER — Ambulatory Visit (INDEPENDENT_AMBULATORY_CARE_PROVIDER_SITE_OTHER): Admitting: Internal Medicine

## 2024-07-11 VITALS — BP 129/66 | HR 77 | Ht 69.0 in | Wt 244.0 lb

## 2024-07-11 DIAGNOSIS — J0110 Acute frontal sinusitis, unspecified: Secondary | ICD-10-CM | POA: Insufficient documentation

## 2024-07-11 MED ORDER — AMOXICILLIN-POT CLAVULANATE 875-125 MG PO TABS: 1.0000 | ORAL_TABLET | Freq: Two times a day (BID) | ORAL | 0 refills | Status: AC

## 2024-07-11 MED ORDER — FLUTICASONE PROPIONATE 50 MCG/ACT NA SUSP: 2.0000 | Freq: Every day | NASAL | 1 refills | Status: DC

## 2024-07-11 MED ORDER — PROMETHAZINE-DM 6.25-15 MG/5ML PO SYRP: 5.0000 mL | ORAL_SOLUTION | Freq: Four times a day (QID) | ORAL | 0 refills | Status: AC | PRN

## 2024-07-11 NOTE — Assessment & Plan Note (Signed)
 Started empiric Augmentin as he has persistent symptoms despite symptomatic treatment Flonase as needed for nasal congestion/allergies Advised to use sinus inhaler and/or vaporizer as needed for nasal congestion Promethazine-DM syrup as needed for cough

## 2024-07-11 NOTE — Progress Notes (Signed)
 Acute Office Visit  Subjective:    Patient ID: Caleb Hunt, male    DOB: 1961/03/08, 63 y.o.   MRN: [id number]  Chief Complaint  Patient presents with   Nasal Congestion    Pt reports coughing and congested , had been doing theraflu and mucinex no relief.     HPI Patient is in today for complaint of nasal congestion, postnasal drip and cough with clear expectoration for the last 5 days.  He denies any fever, chills, dyspnea or wheezing currently.  He has tried taking TheraFlu and Mucinex without much relief.  Denies any recent sick contacts.  Past Medical History:  Diagnosis Date   Hep C w/o coma, chronic (HCC)     Past Surgical History:  Procedure Laterality Date   CHOLECYSTECTOMY     COLONOSCOPY WITH PROPOFOL  N/A 06/11/2020   Procedure: COLONOSCOPY WITH PROPOFOL ;  Surgeon: Eartha Angelia Sieving, MD;  Location: [facility];  Service: Gastroenterology;  Laterality: N/A;  945   POLYPECTOMY  06/11/2020   Procedure: POLYPECTOMY;  Surgeon: Eartha Angelia, Sieving, MD;  Location: [facility];  Service: Gastroenterology;;    Family History  Problem Relation Age of Onset   Diabetes Mother    Diabetes Father    Heart disease Father    Hepatitis C Father    Diabetes Sister    Psoriasis Sister     Social History   Socioeconomic History   Marital status: Married    Spouse name: Not on file   Number of children: Not on file   Years of education: Not on file   Highest education level: GED or equivalent  Occupational History   Not on file  Tobacco Use   Smoking status: Every Day    Current packs/day: 0.25    Types: Cigarettes   Smokeless tobacco: Never   Tobacco comments:    Patient states he smokes one cigarette daily  Vaping Use   Vaping status: Never Used  Substance and Sexual Activity   Alcohol use: Yes    Comment: beer daily   Drug use: No   Sexual activity: Not on file  Other Topics Concern   Not on file  Social History Narrative   Not on file    Social Drivers of Health   Financial Resource Strain: Low Risk  (06/26/2024)   Overall Financial Resource Strain (CARDIA)    Difficulty of Paying Living Expenses: Not very hard  Food Insecurity: No Food Insecurity (06/26/2024)   Hunger Vital Sign    Worried About Running Out of Food in the Last Year: Never true    Ran Out of Food in the Last Year: Never true  Transportation Needs: No Transportation Needs (06/26/2024)   PRAPARE - Administrator, Civil Service (Medical): No    Lack of Transportation (Non-Medical): No  Physical Activity: Inactive (06/26/2024)   Exercise Vital Sign    Days of Exercise per Week: 0 days    Minutes of Exercise per Session: Not on file  Stress: Patient Declined (06/26/2024)   [employer] of Occupational Health - Occupational Stress Questionnaire    Feeling of Stress: Patient declined  Social Connections: Socially Isolated (06/26/2024)   Social Connection and Isolation Panel    Frequency of Communication with Friends and Family: Three times a week    Frequency of Social Gatherings with Friends and Family: Once a week    Attends Religious Services: Patient declined    Database Administrator or Organizations: No  Attends Banker Meetings: Not on file    Marital Status: Widowed  Intimate Partner Violence: Not on file    Outpatient Medications Prior to Visit  Medication Sig Dispense Refill   acetaminophen (TYLENOL) 500 MG tablet Take 500 mg by mouth every 6 (six) hours as needed for mild pain (pain score 1-3) or fever.     Multiple Vitamins-Minerals (MULTIVITAMIN WITH MINERALS) tablet Take 1 tablet by mouth daily.     No facility-administered medications prior to visit.    No Known Allergies  Review of Systems  Constitutional:  Negative for chills and fever.  HENT:  Positive for congestion, postnasal drip and sore throat.   Eyes:  Negative for pain and discharge.  Respiratory:  Positive for cough. Negative for shortness  of breath.   Cardiovascular:  Negative for chest pain and palpitations.  Gastrointestinal:  Negative for constipation, diarrhea, nausea and vomiting.  Endocrine: Negative for polydipsia and polyuria.  Genitourinary:  Negative for dysuria and hematuria.  Musculoskeletal:  Negative for neck pain and neck stiffness.  Skin:  Negative for rash.  Neurological:  Negative for dizziness and weakness.  Psychiatric/Behavioral:  Negative for agitation and behavioral problems.        Objective:    Physical Exam Vitals reviewed.  Constitutional:      General: He is not in acute distress.    Appearance: He is not diaphoretic.  HENT:     Head: Normocephalic and atraumatic.     Nose: Congestion present.     Right Sinus: Frontal sinus tenderness present.     Left Sinus: Frontal sinus tenderness present.     Mouth/Throat:     Mouth: Mucous membranes are moist.     Pharynx: Posterior oropharyngeal erythema present.  Eyes:     General: No scleral icterus.    Extraocular Movements: Extraocular movements intact.  Cardiovascular:     Rate and Rhythm: Normal rate and regular rhythm.     Heart sounds: Normal heart sounds. No murmur heard. Pulmonary:     Breath sounds: Normal breath sounds. No wheezing or rales.  Musculoskeletal:     Cervical back: Neck supple. No tenderness.     Right lower leg: No edema.     Left lower leg: No edema.  Skin:    General: Skin is warm.  Neurological:     General: No focal deficit present.     Mental Status: He is alert and oriented to person, place, and time.  Psychiatric:        Mood and Affect: Mood normal.        Behavior: Behavior normal.     BP 129/66   Pulse 77   Ht 5' 9 (1.753 m)   Wt 244 lb (110.7 kg)   SpO2 97%   BMI 36.03 kg/m  Wt Readings from Last 3 Encounters:  07/11/24 244 lb (110.7 kg)  06/26/24 241 lb 6.4 oz (109.5 kg)  09/22/23 220 lb 7.4 oz (100 kg)        Assessment & Plan:   Problem List Items Addressed This Visit        Respiratory   Acute non-recurrent frontal sinusitis - Primary   Started empiric Augmentin as he has persistent symptoms despite symptomatic treatment Flonase as needed for nasal congestion/allergies Advised to use sinus inhaler and/or vaporizer as needed for nasal congestion Promethazine-DM syrup as needed for cough      Relevant Medications   amoxicillin-clavulanate (AUGMENTIN) 875-125 MG tablet   promethazine-dextromethorphan (PROMETHAZINE-DM)  6.25-15 MG/5ML syrup   fluticasone (FLONASE) 50 MCG/ACT nasal spray     Meds ordered this encounter  Medications   amoxicillin-clavulanate (AUGMENTIN) 875-125 MG tablet    Sig: Take 1 tablet by mouth 2 (two) times daily.    Dispense:  14 tablet    Refill:  0   promethazine-dextromethorphan (PROMETHAZINE-DM) 6.25-15 MG/5ML syrup    Sig: Take 5 mLs by mouth 4 (four) times daily as needed.    Dispense:  118 mL    Refill:  0   fluticasone (FLONASE) 50 MCG/ACT nasal spray    Sig: Place 2 sprays into both nostrils daily.    Dispense:  16 g    Refill:  1     Imberly Troxler MARLA Blanch, MD

## 2024-07-11 NOTE — Patient Instructions (Signed)
 Please start taking Augmentin as prescribed.  Please use Flonase for nasal congestion.  Please take Promethazine-DM syrup as needed for cough.

## 2024-08-02 ENCOUNTER — Other Ambulatory Visit: Payer: Self-pay | Admitting: Internal Medicine

## 2024-08-02 DIAGNOSIS — J0110 Acute frontal sinusitis, unspecified: Secondary | ICD-10-CM

## 2024-12-27 ENCOUNTER — Ambulatory Visit: Admitting: Internal Medicine
